# Patient Record
Sex: Female | Born: 1940
Health system: Southern US, Community
[De-identification: ages and names within clinical notes are randomized; demographics above are authoritative.]

## PROBLEM LIST (undated history)

## (undated) DIAGNOSIS — T7840XA Allergy, unspecified, initial encounter: Secondary | ICD-10-CM

## (undated) DIAGNOSIS — H269 Unspecified cataract: Secondary | ICD-10-CM

## (undated) DIAGNOSIS — I10 Essential (primary) hypertension: Secondary | ICD-10-CM

## (undated) DIAGNOSIS — R011 Cardiac murmur, unspecified: Secondary | ICD-10-CM

## (undated) DIAGNOSIS — C801 Malignant (primary) neoplasm, unspecified: Secondary | ICD-10-CM

## (undated) DIAGNOSIS — E079 Disorder of thyroid, unspecified: Secondary | ICD-10-CM

## (undated) HISTORY — DX: Cardiac murmur, unspecified: R01.1

## (undated) HISTORY — PX: EYE SURGERY: SHX253

## (undated) HISTORY — DX: Disorder of thyroid, unspecified: E07.9

## (undated) HISTORY — DX: Unspecified cataract: H26.9

## (undated) HISTORY — DX: Essential (primary) hypertension: I10

## (undated) HISTORY — DX: Malignant (primary) neoplasm, unspecified: C80.1

## (undated) HISTORY — PX: BREAST BIOPSY: SHX20

## (undated) HISTORY — DX: Allergy, unspecified, initial encounter: T78.40XA

---
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
## 1974-01-03 HISTORY — PX: CHOLECYSTECTOMY: SHX55

## 1986-01-03 HISTORY — PX: ABDOMINAL HYSTERECTOMY: SHX81

## 1987-01-04 HISTORY — PX: THYROID SURGERY: SHX805

## 2006-02-20 ENCOUNTER — Ambulatory Visit: Payer: Self-pay | Admitting: Gastroenterology

## 2006-03-06 ENCOUNTER — Ambulatory Visit: Payer: Self-pay | Admitting: Gastroenterology

## 2011-02-16 ENCOUNTER — Encounter: Payer: Self-pay | Admitting: Gastroenterology

## 2011-11-15 ENCOUNTER — Encounter: Payer: Self-pay | Admitting: Gastroenterology

## 2018-06-12 ENCOUNTER — Other Ambulatory Visit: Payer: Self-pay

## 2018-06-12 ENCOUNTER — Encounter: Payer: Self-pay | Admitting: Family Medicine

## 2018-06-12 ENCOUNTER — Ambulatory Visit (INDEPENDENT_AMBULATORY_CARE_PROVIDER_SITE_OTHER): Payer: Medicare Other | Admitting: Family Medicine

## 2018-06-12 DIAGNOSIS — E890 Postprocedural hypothyroidism: Secondary | ICD-10-CM | POA: Diagnosis not present

## 2018-06-12 MED ORDER — SYNTHROID 125 MCG PO TABS: 125.0000 ug | ORAL_TABLET | Freq: Every day | ORAL | 3 refills | Status: DC

## 2018-06-12 NOTE — Progress Notes (Signed)
Chief Complaint  Patient presents with  . New Patient (Initial Visit)       New Patient Visit SUBJECTIVE: HPI: Melanie Holmes is an 78 y.o.female who is being seen for establishing care.  The patient was previously seen at [facility]. Due to COVID-19 pandemic, we are interacting via web portal for an electronic face-to-face visit. I verified patient's ID using 2 identifiers. Patient agreed to proceed with visit via this method. Patient is at home, I am at home. Patient and I are present for visit.   Hypothyroidism Patient presents for follow-up of hypothyroidism.  Reports compliance with medication- changed to generic levothyroxine 112 mcg/d in Jan, 2020. Current symptoms include: fatigue, weight gain and feeling slow Denies: feeling cold and cold intolerance, losing hair, anxiousness, feeling excessive energy, tremulousness, palpitations and sweating She believes her dose should be increased Did best on 125 mcg/d, brand name Synthroid.   Allergies  Allergen Reactions  . Clonidine Derivatives     Do not remember what happens  . Metoprolol     Heart pounding   . Rosuvastatin     Muscle aches  . Simvastatin     Muscle aches  . Adhesive [Tape] Rash  . Morphine And Related Rash  . Penicillins Rash    No past medical history on file. Family History  Problem Relation Age of Onset  . Colon cancer Mother   . Esophageal cancer Father    Allergies  Allergen Reactions  . Clonidine Derivatives     Do not remember what happens  . Metoprolol     Heart pounding   . Rosuvastatin     Muscle aches  . Simvastatin     Muscle aches  . Adhesive [Tape] Rash  . Morphine And Related Rash  . Penicillins Rash    Current Outpatient Medications:  .  SYNTHROID 125 MCG tablet, Take 1 tablet (125 mcg total) by mouth daily before breakfast., Disp: 30 tablet, Rfl: 3  ROS Cardiovascular: Denies chest pain  Respiratory: Denies dyspnea   OBJECTIVE: No conversational dyspnea Age appropriate judgment  and insight Nml affect and mood  ASSESSMENT/PLAN: Postablative hypothyroidism - Plan: SYNTHROID 125 MCG tablet  Patient instructed to sign release of records form from her previous PCP. Go back to brand name Synthroid, 125 mcg/d to see how she does.  Patient should return 7 weeks, reck thyroid/energy levels. The patient voiced understanding and agreement to the plan.   [place], DO 06/12/18  1:07 PM

## 2018-08-20 ENCOUNTER — Encounter: Payer: Self-pay | Admitting: Family Medicine

## 2018-08-21 ENCOUNTER — Encounter: Payer: Self-pay | Admitting: Family Medicine

## 2018-08-21 ENCOUNTER — Telehealth: Payer: Self-pay

## 2018-08-21 ENCOUNTER — Ambulatory Visit (INDEPENDENT_AMBULATORY_CARE_PROVIDER_SITE_OTHER): Payer: Medicare Other | Admitting: Family Medicine

## 2018-08-21 ENCOUNTER — Other Ambulatory Visit: Payer: Self-pay

## 2018-08-21 VITALS — BP 140/82 | HR 91 | Temp 97.4°F | Ht 66.0 in | Wt 213.2 lb

## 2018-08-21 DIAGNOSIS — N3281 Overactive bladder: Secondary | ICD-10-CM | POA: Diagnosis not present

## 2018-08-21 DIAGNOSIS — E890 Postprocedural hypothyroidism: Secondary | ICD-10-CM | POA: Insufficient documentation

## 2018-08-21 MED ORDER — MIRABEGRON ER 25 MG PO TB24: 25.0000 mg | ORAL_TABLET | Freq: Every day | ORAL | 1 refills | Status: DC

## 2018-08-21 NOTE — Progress Notes (Signed)
Chief Complaint  Patient presents with  . bladder problem and labs    labs for thyroid    Subjective: Patient is a 78 y.o. female here for f/u thyroid.   Hypothyroidism Patient presents for follow-up of hypothyroidism.  Reports compliance with medication. Current symptoms include: denies fatigue, unxplained weight changes, heat/cold intolerance, bowel/skin changes or CVS symptoms She believes her dose should be unchanged  Has hx of overactive bladder that responded well to medication. Upon review of records, she was on Myrbetriq. Did not have issues with either cost or AE's. Going to work with [employer] for next 1.5 mo and would like some so she does not have to go every 2 hrs. S/s's does not bother her enough for  A medication in her current situation. Denies bleeding or pain.   ROS: Endo: As noted in HPI GU: +freq  Past Medical History:  Diagnosis Date  . Thyroid disease     Objective: BP 140/82 (BP Location: Left Arm, Patient Position: Sitting, Cuff Size: Normal)   Pulse 91   Temp (!) 97.4 F (36.3 C) (Oral)   Ht 5\' 6"  (1.676 m)   Wt 213 lb 4 oz (96.7 kg)   SpO2 97%   BMI 34.42 kg/m  General: Awake, appears stated age HEENT: MMM, EOMi Heart: RRR, no LE edema GI: BS+, S, NT, ND Lungs: CTAB, no rales, wheezes or rhonchi. No accessory muscle use Psych: Age appropriate judgment and insight, normal affect and mood  Assessment and Plan: Postablative hypothyroidism - Plan: T4, free, TSH, she feels good at current dose. Hopefully no changes.  Overactive bladder - Plan: mirabegron ER (MYRBETRIQ) 25 MG TB24 tablet, short course during next 1.5 mo.   Orders as above. The patient voiced understanding and agreement to the plan.  [place], DO 08/21/18  3:59 PM

## 2018-08-21 NOTE — Telephone Encounter (Signed)
Myrbetriq not covered- preferrred alternatives: oxybutynin 5mg  oxybutynin ER 5MG , 10MG , 15MG , Trospium 20MG , tolterodine tartrate ER 2mg  and 4mg .

## 2018-08-21 NOTE — Telephone Encounter (Signed)
Noted  

## 2018-08-21 NOTE — Patient Instructions (Signed)
Give us 2-3 business days to get the results of your labs back.   Keep the diet clean and stay active.  I recommend getting the flu shot in mid October. This suggestion would change if the CDC comes out with a different recommendation.   Let us know if you need anything. 

## 2018-08-21 NOTE — Telephone Encounter (Signed)
Let pt know the change and see if she is OK with [place] making a change. Ty.

## 2018-08-21 NOTE — Telephone Encounter (Signed)
Called the patient and she will pay for it to get what she wants.  Please do not change

## 2018-08-22 LAB — TSH: TSH: 0.14 u[IU]/mL — ABNORMAL LOW (ref 0.35–4.50)

## 2018-08-22 LAB — T4, FREE: Free T4: 0.93 ng/dL (ref 0.60–1.60)

## 2018-10-08 ENCOUNTER — Other Ambulatory Visit: Payer: Self-pay | Admitting: Family Medicine

## 2018-10-08 DIAGNOSIS — E890 Postprocedural hypothyroidism: Secondary | ICD-10-CM

## 2018-10-08 MED ORDER — SYNTHROID 125 MCG PO TABS: 125.0000 ug | ORAL_TABLET | Freq: Every day | ORAL | 3 refills | Status: DC

## 2018-10-23 ENCOUNTER — Ambulatory Visit (INDEPENDENT_AMBULATORY_CARE_PROVIDER_SITE_OTHER): Payer: Medicare Other | Admitting: Family Medicine

## 2018-10-23 ENCOUNTER — Encounter: Payer: Self-pay | Admitting: Family Medicine

## 2018-10-23 ENCOUNTER — Other Ambulatory Visit: Payer: Self-pay

## 2018-10-23 VITALS — BP 132/80 | HR 69 | Temp 97.5°F | Ht 66.0 in | Wt 214.4 lb

## 2018-10-23 DIAGNOSIS — Z23 Encounter for immunization: Secondary | ICD-10-CM

## 2018-10-23 DIAGNOSIS — N393 Stress incontinence (female) (male): Secondary | ICD-10-CM | POA: Diagnosis not present

## 2018-10-23 DIAGNOSIS — E2839 Other primary ovarian failure: Secondary | ICD-10-CM | POA: Diagnosis not present

## 2018-10-23 NOTE — Patient Instructions (Addendum)
Keep up the good work.  Continue doing the Kegel exercises.   Don't drink within 1 hr of bedtime. Consider pickle juice before bed or magnesium to manage cramps.   Let us know if you need anything.

## 2018-10-23 NOTE — Progress Notes (Signed)
Chief Complaint  Patient presents with  . Annual Exam    Subjective: Patient is a 78 y.o. female here for f/u.  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 She did not take it 2/2 cost, but didn't need it as she was relocated to working at home. She does leak urine when she sits up. 2 children. No pain or bleeding. Drinks a lot of water, otherwise will have cramps.  Takes magnesium daily.  ROS: GU: + leakage  Past Medical History:  Diagnosis Date  . Thyroid disease     Objective: BP 132/80 (BP Location: Left Arm, Patient Position: Sitting, Cuff Size: Large)   Pulse 69   Temp (!) 97.5 F (36.4 C) (Temporal)   Ht 5\' 6"  (1.676 m)   Wt 214 lb 6 oz (97.2 kg)   SpO2 98%   BMI 34.60 kg/m  General: Awake, appears stated age Heart: RRR, no LE edema Lungs: CTAB, no rales, wheezes or rhonchi. No accessory muscle use Psych: Age appropriate judgment and insight, normal affect and mood  Assessment and Plan: Stress incontinence  Estrogen deficiency - Plan: DG Bone Density  Need for tetanus booster  Need for Td vaccine - Plan: Td vaccine greater than or equal to 7yo preservative free IM  Stay hydrated.  Do not drink water more than 1-2 hours of bedtime.  Consider pickle juice for cramping.  Continue Kegel exercises. I will see her in 1 year. The patient voiced understanding and agreement to the plan.  [place], DO 10/23/18  1:05 PM

## 2018-11-03 ENCOUNTER — Encounter: Payer: Self-pay | Admitting: Family Medicine

## 2019-01-28 ENCOUNTER — Encounter: Payer: Self-pay | Admitting: Family Medicine

## 2019-02-21 ENCOUNTER — Encounter: Payer: Self-pay | Admitting: Family Medicine

## 2019-02-21 DIAGNOSIS — E890 Postprocedural hypothyroidism: Secondary | ICD-10-CM

## 2019-02-21 MED ORDER — SYNTHROID 125 MCG PO TABS
125.0000 ug | ORAL_TABLET | Freq: Every day | ORAL | 5 refills | Status: DC
Start: 2019-02-21 — End: 2019-04-19

## 2019-04-16 ENCOUNTER — Other Ambulatory Visit: Payer: Self-pay

## 2019-04-17 ENCOUNTER — Encounter: Payer: Self-pay | Admitting: Family Medicine

## 2019-04-17 ENCOUNTER — Other Ambulatory Visit: Payer: Self-pay | Admitting: Family Medicine

## 2019-04-17 ENCOUNTER — Telehealth: Payer: Self-pay

## 2019-04-17 ENCOUNTER — Ambulatory Visit (INDEPENDENT_AMBULATORY_CARE_PROVIDER_SITE_OTHER): Payer: Medicare Other | Admitting: Family Medicine

## 2019-04-17 ENCOUNTER — Telehealth: Payer: Self-pay | Admitting: Family Medicine

## 2019-04-17 VITALS — BP 148/90 | HR 72 | Temp 95.4°F | Ht 66.0 in | Wt 220.1 lb

## 2019-04-17 DIAGNOSIS — R5383 Other fatigue: Secondary | ICD-10-CM

## 2019-04-17 DIAGNOSIS — M7989 Other specified soft tissue disorders: Secondary | ICD-10-CM

## 2019-04-17 DIAGNOSIS — E890 Postprocedural hypothyroidism: Secondary | ICD-10-CM | POA: Diagnosis not present

## 2019-04-17 DIAGNOSIS — N3281 Overactive bladder: Secondary | ICD-10-CM

## 2019-04-17 DIAGNOSIS — R252 Cramp and spasm: Secondary | ICD-10-CM | POA: Diagnosis not present

## 2019-04-17 LAB — CBC
HCT: 36.1 % (ref 36.0–46.0)
Hemoglobin: 12.4 g/dL (ref 12.0–15.0)
MCHC: 34.3 g/dL (ref 30.0–36.0)
MCV: 96.2 fl (ref 78.0–100.0)
Platelets: 178 10*3/uL (ref 150.0–400.0)
RBC: 3.75 Mil/uL — ABNORMAL LOW (ref 3.87–5.11)
RDW: 12.3 % (ref 11.5–15.5)
WBC: 5.5 10*3/uL (ref 4.0–10.5)

## 2019-04-17 LAB — COMPREHENSIVE METABOLIC PANEL
ALT: 14 U/L (ref 0–35)
AST: 17 U/L (ref 0–37)
Albumin: 4.2 g/dL (ref 3.5–5.2)
Alkaline Phosphatase: 68 U/L (ref 39–117)
BUN: 16 mg/dL (ref 6–23)
CO2: 29 mEq/L (ref 19–32)
Calcium: 9.5 mg/dL (ref 8.4–10.5)
Chloride: 101 mEq/L (ref 96–112)
Creatinine, Ser: 0.93 mg/dL (ref 0.40–1.20)
GFR: 58.21 mL/min — ABNORMAL LOW (ref >60.00)
Glucose, Bld: 116 mg/dL — ABNORMAL HIGH (ref 70–99)
Potassium: 4.4 mEq/L (ref 3.5–5.1)
Sodium: 138 mEq/L (ref 135–145)
Total Bilirubin: 0.4 mg/dL (ref 0.2–1.2)
Total Protein: 6.3 g/dL (ref 6.0–8.3)

## 2019-04-17 LAB — T4, FREE: Free T4: 1.31 ng/dL (ref 0.60–1.60)

## 2019-04-17 LAB — MAGNESIUM: Magnesium: 1.8 mg/dL (ref 1.5–2.5)

## 2019-04-17 LAB — TSH: TSH: 0.37 u[IU]/mL (ref 0.35–4.50)

## 2019-04-17 MED ORDER — OXYBUTYNIN CHLORIDE ER 10 MG PO TB24: 10.0000 mg | ORAL_TABLET | Freq: Every day | ORAL | 1 refills | Status: DC

## 2019-04-17 MED ORDER — MIRABEGRON ER 25 MG PO TB24: 25.0000 mg | ORAL_TABLET | Freq: Every day | ORAL | 1 refills | Status: DC

## 2019-04-17 NOTE — Progress Notes (Signed)
Chief Complaint  Patient presents with  . Muscle Pain    itching all over  . Urinary Frequency  . Fatigue  . Edema    feet and legs    Subjective: Patient is a 79 y.o. female here for f/u.  Intermittent fatigue lasting for 1 hr at a time. Happens around twice weekly.  She has not been sleeping well as her overactive bladder is causing her to wake up every 2 hours at night.  She is eating and drinking normally overall.  She denies any snoring or waking up gasping for air.  She does take her thyroid medication routinely.  OAB This has gotten worse as she is urinating at night every 2 hrs. No constipation or infectious symptoms.  No known history of diabetes.  We have prescribed her Myrbetriq in the past but it cost $500 out-of-pocket.  She continues have leg cramping and lower extremity swelling.  She is starting to exercise again.  Water intake is somewhat limited due to frequent urination.  She has been taking magnesium daily as well.  Past Medical History:  Diagnosis Date  . Thyroid disease     Objective: BP (!) 148/90 (BP Location: Right Arm, Patient Position: Sitting, Cuff Size: Normal)   Pulse 72   Temp (!) 95.4 F (35.2 C) (Temporal)   Ht 5\' 6"  (1.676 m)   Wt 220 lb 2 oz (99.8 kg)   SpO2 97%   BMI 35.53 kg/m  General: Awake, appears stated age HEENT: MMM, EOMi Neck: Supple, no obvious thyromegaly or nodules appreciated Heart: RRR, 1+ pitting edema bilaterally tapering at the knees, no bruits Lungs: CTAB, no rales, wheezes or rhonchi. No accessory muscle use Abdomen: Bowel sounds present, soft, nontender, nondistended Psych: Age appropriate judgment and insight, normal affect and mood  Assessment and Plan: Postablative hypothyroidism - Plan: TSH, T4, free  Other fatigue - Plan: CBC, Comprehensive metabolic panel  Localized swelling of both lower extremities  Muscle cramp - Plan: Magnesium  Overactive bladder - Plan: mirabegron ER (MYRBETRIQ) 25 MG TB24  tablet  Check above labs.  I think her fatigue is likely related to insufficient sleep from frequent nighttime awakenings.  We will try to call in Myrbetriq again.  If it is too expensive, will call and Ditropan XL. Follow-up in 6 weeks. The patient voiced understanding and agreement to the plan.  [place], DO 04/17/19  2:26 PM

## 2019-04-17 NOTE — Telephone Encounter (Signed)
Myrbetriq not covered by insurance. Preferred alternatives: oxybutynin 10mg , solifenacin 10mg , tolterodine 4mg .

## 2019-04-17 NOTE — Telephone Encounter (Signed)
Alt sent in

## 2019-04-17 NOTE — Patient Instructions (Addendum)
Give [place] 2-3 business days to get the results of your labs back.   For the swelling in your lower extremities, be sure to elevate your legs when able, mind the salt intake, stay physically active and consider wearing compression stockings.  Keep the diet clean and stay active.   Let me know if there are cost issues.   Let us know if you need anything.

## 2019-04-17 NOTE — Telephone Encounter (Signed)
Per patient please do not do anything with insurance papers in the morning. Patient will send a letter to you via my chart tonight.

## 2019-04-19 ENCOUNTER — Other Ambulatory Visit: Payer: Self-pay | Admitting: Family Medicine

## 2019-04-19 DIAGNOSIS — E890 Postprocedural hypothyroidism: Secondary | ICD-10-CM

## 2019-04-19 MED ORDER — SYNTHROID 125 MCG PO TABS: 125.0000 ug | ORAL_TABLET | Freq: Every day | ORAL | 1 refills | Status: DC

## 2019-04-22 ENCOUNTER — Other Ambulatory Visit: Payer: Self-pay | Admitting: Family Medicine

## 2019-04-22 MED ORDER — OXYBUTYNIN CHLORIDE ER 10 MG PO TB24: 10.0000 mg | ORAL_TABLET | Freq: Every day | ORAL | 1 refills | Status: DC

## 2019-05-01 ENCOUNTER — Telehealth: Payer: Medicare Other | Admitting: Family

## 2019-05-01 DIAGNOSIS — M545 Low back pain, unspecified: Secondary | ICD-10-CM

## 2019-05-01 MED ORDER — NAPROXEN 500 MG PO TABS: 500.0000 mg | ORAL_TABLET | Freq: Two times a day (BID) | ORAL | 0 refills | Status: DC

## 2019-05-01 MED ORDER — BACLOFEN 10 MG PO TABS: 5.0000 mg | ORAL_TABLET | Freq: Three times a day (TID) | ORAL | 0 refills | Status: DC

## 2019-05-01 NOTE — Progress Notes (Signed)

## 2019-05-03 ENCOUNTER — Telehealth: Payer: Self-pay | Admitting: Family Medicine

## 2019-05-03 ENCOUNTER — Telehealth: Payer: Medicare Other | Admitting: Physician Assistant

## 2019-05-03 DIAGNOSIS — M549 Dorsalgia, unspecified: Secondary | ICD-10-CM

## 2019-05-03 NOTE — Telephone Encounter (Signed)
Caller: Melanie Holmes  Call Back # [phone]  Subject : Back Pain, Eye Pain  Patient states that she had a e-visit on Wednesday. Patient states still having back pain no relief, want another medication sent to pharmancy.   Please Advise

## 2019-05-03 NOTE — Telephone Encounter (Signed)
Called the patient offered the patient an appt with PCP the first of next week and/or  PCP stated to go to UC this weekend.  The patient agreed to go to UC this weekend. If no better would give [place] a call on Monday to try to schedule with PCP.

## 2019-05-03 NOTE — Telephone Encounter (Signed)
I would follow up with the group that evaluated her for this. Ty.

## 2019-05-03 NOTE — Progress Notes (Signed)
Based on what you shared with me, I feel your condition warrants further evaluation and I recommend that you be seen for a face to face office visit.  Given your symptoms have not improved with the initial treatment plan, and you've experienced side effects with the medications, I would recommend face-to-face evaluation to best formulate a treatment plan and determine if you need any imaging at this time. I would recommend seeking evaluation today at your PCP's office or local urgent care.    NOTE: If you entered your credit card information for this eVisit, you will not be charged. You may see a "hold" on your card for the $35 but that hold will drop off and you will not have a charge processed.   If you are having a true medical emergency please call 911.      For an urgent face to face visit, [place] has five urgent care centers for your convenience:      NEW:  [facility] at [facility] Directions [phone] [address] [place], [place] [address] . 10 am - 6pm Monday - Friday    [facility]) Get Driving Directions [phone] [address] [place], [place] [address] . 10 am to 8 pm Monday-Friday . 12 pm to 8 pm [facility] at [facility] Get Driving Directions [phone] [address] [place], [place] [address] . 8 am to 8 pm Monday-Friday . 9 am to 6 pm Saturday . 11 am to 6 pm Sunday     [facility] at [facility] Get Driving Directions  [phone] [address] [place], [place] [address] . 8 am to 8 pm Monday-Friday . 8 am to 4 pm [facility] Urgent Care at [facility] Directions [phone] [address] [place], [place] [address] . 12 pm to 6 pm Monday-Friday      Your e-visit answers were reviewed by a board certified advanced clinical practitioner  to complete your personal care plan.  Thank you for using e-Visits.   Greater than 5 minutes, yet less than 10 minutes of time have been spent researching, coordinating, and implementing care for this patient today.

## 2019-05-04 ENCOUNTER — Other Ambulatory Visit: Payer: Self-pay

## 2019-05-04 ENCOUNTER — Emergency Department (INDEPENDENT_AMBULATORY_CARE_PROVIDER_SITE_OTHER)
Admission: EM | Admit: 2019-05-04 | Discharge: 2019-05-04 | Disposition: A | Discharge status: Home / Self Care | Payer: Medicare Other | Source: Home / Self Care

## 2019-05-04 DIAGNOSIS — M545 Low back pain, unspecified: Secondary | ICD-10-CM

## 2019-05-04 MED ORDER — METHYLPREDNISOLONE ACETATE 80 MG/ML IJ SUSP
60.0000 mg | Freq: Once | INTRAMUSCULAR | Status: AC
  Administered 2019-05-04: 60 mg via INTRAMUSCULAR

## 2019-05-04 MED ORDER — HYDROCODONE-ACETAMINOPHEN 5-325 MG PO TABS: ORAL_TABLET | ORAL | 0 refills | Status: DC

## 2019-05-04 NOTE — ED Provider Notes (Signed)
[facility]    CSN: [id number] Arrival date & time: 05/04/19  1329      History   Chief Complaint Chief Complaint  Patient presents with  . Back Pain    HPI Gearldene Holmes is a 79 y.o. female.   HPI Melanie Holmes is a 79 y.o. female presenting to UC with c/o 5 days of worsening lower back pain that started on Tuesday, 4/27.  Pt painted her 5 bedroom house and finished on Monday.  On Tuesday, she was sitting down when she bent over to pick up a kitten and felt a "pull, like guitar strings" in her back.  She did an E-visit and was prescribed baclofen and naproxen. She has taken twice daily but developed "flashing" in her eyes and blurred vision yesterday. She called to have her medication changed but was advised she needed to be evaluated in person. Pt plans to see her PCP next week but wants to see if there is a different medication she can take to help with the severe pain. She reports having an overactive bladder so she has to get out of bed several times at night, making her back pain worse. She notes she has trouble making it to the bathroom in time due to having to walk slower with a cane due to the new back pain. Denies radiation of pain or numbness in arms or legs. No change in her bowel or bladder habits.    Past Medical History:  Diagnosis Date  . Thyroid disease     Patient Active Problem List   Diagnosis Date Noted  . Postablative hypothyroidism 08/21/2018    Past Surgical History:  Procedure Laterality Date  . ABDOMINAL HYSTERECTOMY  1988   With ovaries  . BREAST BIOPSY     1961 left side tumor removed, 1983 right side cyst removed  . CHOLECYSTECTOMY  1976  . THYROID SURGERY  1989   "had hot spot removed"  . TONSILLECTOMY  1947    OB History   No obstetric history on file.      Home Medications    Prior to Admission medications   Medication Sig Start Date End Date Taking? Authorizing Provider  HYDROcodone-acetaminophen (NORCO/VICODIN) 5-325 MG  tablet Take 1 tab every 6-8 hours for moderate to severe pain 05/04/19   Lurene Shadow, PA-C  SYNTHROID 125 MCG tablet Take 1 tablet (125 mcg total) by mouth daily before breakfast. 04/19/19   Sharlene Dory, DO    Family History Family History  Problem Relation Age of Onset  . Colon cancer Mother   . Esophageal cancer Father     Social History Social History   Tobacco Use  . Smoking status: Former Games developer  . Smokeless tobacco: Never Used  . Tobacco comment: stopped 40 years ago-06/12/18  Substance Use Topics  . Alcohol use: Not Currently  . Drug use: Never     Allergies   Clonidine derivatives, Metoprolol, Rosuvastatin, Simvastatin, Adhesive [tape], Morphine and related, and Penicillins   Review of Systems Review of Systems  Constitutional: Negative for chills and fever.  Genitourinary: Positive for frequency (chronic, overactive bladder). Negative for dysuria and hematuria.  Musculoskeletal: Positive for back pain, gait problem and myalgias. Negative for neck pain and neck stiffness.  Neurological: Negative for weakness and numbness.     Physical Exam Triage Vital Signs ED Triage Vitals  Enc Vitals Group     BP --      Pulse --  Resp --      Temp --      Temp src --      SpO2 --      Weight 05/04/19 1356 220 lb 0.3 oz (99.8 kg)     Height 05/04/19 1356 5\' 6"  (1.676 m)     Head Circumference --      Peak Flow --      Pain Score 05/04/19 1355 2     Pain Loc --      Pain Edu? --      Excl. in [place]? --    No data found.  Updated Vital Signs BP (!) 152/90 (BP Location: Right Arm)   Pulse 77   Temp 98.1 F (36.7 C) (Oral)   Resp 18   Ht 5\' 6"  (1.676 m)   Wt 220 lb 0.3 oz (99.8 kg)   SpO2 98%   BMI 35.51 kg/m   Visual Acuity Right Eye Distance:   Left Eye Distance:   Bilateral Distance:    Right Eye Near:   Left Eye Near:    Bilateral Near:     Physical Exam Vitals and nursing note reviewed.  Constitutional:      Appearance: Normal  appearance. She is well-developed.     Comments: Pt sitting in exam chair, walking cane by her side, pt leaning on side table, appears uncomfortable but is alert, cooperative and pleasant during exam.  HENT:     Head: Normocephalic and atraumatic.  Cardiovascular:     Rate and Rhythm: Normal rate.  Pulmonary:     Effort: Pulmonary effort is normal. No respiratory distress.  Musculoskeletal:        General: Tenderness present. Normal range of motion.     Cervical back: Normal range of motion.       Back:     Comments: Pt uses cane to help ambulate. Increased pain going from sitting to standing position and rotation at the waist.   Skin:    General: Skin is warm and dry.     Findings: No bruising, erythema or rash.  Neurological:     Mental Status: She is alert and oriented to person, place, and time.  Psychiatric:        Behavior: Behavior normal.      UC Treatments / Results  Labs (all labs ordered are listed, but only abnormal results are displayed) Labs Reviewed - No data to display  EKG   Radiology No results found.  Procedures Procedures (including critical care time)  Medications Ordered in UC Medications  methylPREDNISolone acetate (DEPO-MEDROL) injection 60 mg (60 mg Intramuscular Given 05/04/19 1441)    Initial Impression / Assessment and Plan / UC Course  I have reviewed the triage vital signs and the nursing notes.  Pertinent labs & imaging results that were available during my care of the patient were reviewed by me and considered in my medical decision making (see chart for details).     Discussed plain films, pt would like to hold off until f/u with her PCP or chiropractor Will have pt try norco Discontinue baclofen and naproxen Pt did have some ibuprofen this morning w/o side effects, may continue taking.  Discussed symptoms that warrant emergent care in the ED.  AVS provided.   Final Clinical Impressions(s) / UC Diagnoses   Final diagnoses:    Acute bilateral low back pain without sciatica     Discharge Instructions      Norco/Vicodin (hydrocodone-acetaminophen) is a narcotic pain medication, do  not combine these medications with others containing tylenol. While taking, do not drink alcohol, drive, or perform any other activities that requires focus while taking these medications.   You may also take 400-600mg  ibuprofen every 8 hours for back pain. Be sure to take your medication with food to help prevent stomach upset and stomach ulcers.   Please follow up with your family doctor and/or sports medicine this week for further evaluation and treatment of your back pain.  Call 911 or have your husband drive you to the hospital if pain continues to worsen, difficulty walking, change in bowel or bladder habits, weakness or numbness in your legs, or other new concerning symptoms develop.    ED Prescriptions    Medication Sig Dispense Auth. Provider   HYDROcodone-acetaminophen (NORCO/VICODIN) 5-325 MG tablet Take 1 tab every 6-8 hours for moderate to severe pain 10 tablet Lurene Shadow, PA-C     I have reviewed the PDMP during this encounter.   Lurene Shadow, [place] 05/04/19 1557

## 2019-05-04 NOTE — Discharge Instructions (Addendum)
  Norco/Vicodin (hydrocodone-acetaminophen) is a narcotic pain medication, do not combine these medications with others containing tylenol. While taking, do not drink alcohol, drive, or perform any other activities that requires focus while taking these medications.   You may also take 400-600mg  ibuprofen every 8 hours for back pain. Be sure to take your medication with food to help prevent stomach upset and stomach ulcers.   Please follow up with your family doctor and/or sports medicine this week for further evaluation and treatment of your back pain.  Call 911 or have your husband drive you to the hospital if pain continues to worsen, difficulty walking, change in bowel or bladder habits, weakness or numbness in your legs, or other new concerning symptoms develop.

## 2019-05-04 NOTE — ED Triage Notes (Signed)
Pt c/o back pain since Tues. Recently painted 5 bedrooms in her house, finished Monday. On Tues she felt a "pull" in her back when she bent over to pick up a kitten. Had e visit and was rx'd baclofen and naproxen on 4/30. Baclofen gave her "flashing" and blurred vision. Called e visit to have med changed, and was told she needed to be seen in person.

## 2019-05-05 ENCOUNTER — Emergency Department (HOSPITAL_COMMUNITY): Payer: Medicare Other

## 2019-05-05 ENCOUNTER — Emergency Department (HOSPITAL_COMMUNITY)
Admission: EM | Admit: 2019-05-05 | Discharge: 2019-05-05 | Disposition: A | Discharge status: Home / Self Care | Payer: Medicare Other | Attending: Emergency Medicine | Admitting: Emergency Medicine

## 2019-05-05 ENCOUNTER — Other Ambulatory Visit: Payer: Self-pay

## 2019-05-05 ENCOUNTER — Telehealth: Payer: Self-pay | Admitting: Emergency Medicine

## 2019-05-05 ENCOUNTER — Encounter (HOSPITAL_COMMUNITY): Payer: Self-pay | Admitting: Emergency Medicine

## 2019-05-05 DIAGNOSIS — M545 Low back pain, unspecified: Secondary | ICD-10-CM

## 2019-05-05 DIAGNOSIS — E038 Other specified hypothyroidism: Secondary | ICD-10-CM | POA: Diagnosis not present

## 2019-05-05 DIAGNOSIS — Z87891 Personal history of nicotine dependence: Secondary | ICD-10-CM | POA: Insufficient documentation

## 2019-05-05 MED ORDER — KETOROLAC TROMETHAMINE 15 MG/ML IJ SOLN
15.0000 mg | Freq: Once | INTRAMUSCULAR | Status: AC
  Administered 2019-05-05: 15 mg via INTRAVENOUS
  Filled 2019-05-05: qty 1

## 2019-05-05 MED ORDER — DEXAMETHASONE SODIUM PHOSPHATE 10 MG/ML IJ SOLN
10.0000 mg | Freq: Once | INTRAMUSCULAR | Status: AC
  Administered 2019-05-05: 10 mg via INTRAVENOUS
  Filled 2019-05-05: qty 1

## 2019-05-05 MED ORDER — PREDNISONE 20 MG PO TABS
40.0000 mg | ORAL_TABLET | Freq: Every day | ORAL | 0 refills | Status: DC
Start: 2019-05-05 — End: 2019-10-28

## 2019-05-05 MED ORDER — LORAZEPAM 2 MG/ML IJ SOLN
INTRAMUSCULAR | Status: AC
  Administered 2019-05-05: 1.75 mg
  Filled 2019-05-05: qty 1

## 2019-05-05 MED ORDER — GABAPENTIN 100 MG PO CAPS
300.0000 mg | ORAL_CAPSULE | Freq: Once | ORAL | Status: AC
  Administered 2019-05-05: 300 mg via ORAL
  Filled 2019-05-05: qty 3

## 2019-05-05 MED ORDER — METHOCARBAMOL 500 MG PO TABS
500.0000 mg | ORAL_TABLET | Freq: Three times a day (TID) | ORAL | 0 refills | Status: DC
Start: 2019-05-05 — End: 2019-10-28

## 2019-05-05 MED ORDER — HYDROMORPHONE HCL 1 MG/ML IJ SOLN
0.5000 mg | Freq: Once | INTRAMUSCULAR | Status: AC
  Administered 2019-05-05: 0.5 mg via INTRAVENOUS
  Filled 2019-05-05: qty 1

## 2019-05-05 MED ORDER — LORAZEPAM 2 MG/ML IJ SOLN
1.0000 mg | Freq: Once | INTRAMUSCULAR | Status: AC
  Administered 2019-05-05: 1 mg via INTRAVENOUS
  Filled 2019-05-05: qty 1

## 2019-05-05 MED ORDER — SODIUM CHLORIDE 0.9 % IV SOLN: INTRAVENOUS | Status: DC

## 2019-05-05 NOTE — ED Triage Notes (Signed)
Pt c/o back pain since Tuesday after painting in her house. Seen yesterday at [facility], given pain medication. Pt states she has taken 4 doses of prescribed meds but has been nauseated and now her left ankle is swollen and the bottom of her left foot is numb.

## 2019-05-05 NOTE — Discharge Instructions (Signed)
Continue to take previously prescribed pain medication as needed and also use ibuprofen 600mg  every 8 hours as needed for pain.

## 2019-05-05 NOTE — ED Notes (Signed)
Patient verbalizes understanding of discharge instructions. Opportunity for questioning and answers were provided. Armband removed by staff, pt discharged from ED. Pt. ambulatory and discharged home.  

## 2019-05-05 NOTE — Telephone Encounter (Signed)
Call back to pt regarding increased back pain and nausea -during call pt stated her left foot was numb- denied pain - pt directed to go straight to ER for further evaluation per provider, Waylan Rocher PA-C. Pt verbalized an understanding and stated she had a ride to the hospital and would go to the ER at [facility] for further tests.

## 2019-05-05 NOTE — ED Provider Notes (Signed)
[facility] EMERGENCY DEPARTMENT Provider Note   CSN: [id number] Arrival date & time: 05/05/19  1500     History Chief Complaint  Patient presents with  . Back Pain    Melanie Holmes is a 79 y.o. female.  HPI   79 year old female with lower back pain.  Onset last Tuesday. Denies specific trauma but had been painting her home shortly before symptoms began.  Pain has been constant and progressive since then.  Pain initially began in the middle of her lower back.  Did not particularly lateralize.  Is now since begun radiating around into her buttocks and groin.  She has pain at rest.  Exacerbated by movements.  Sometimes spasms some more acute pain.  No urinary complaints.  No fevers or chills.  Denies any history of significant or back issues.  Today developed some numbness on the bottom of her foot which is what prompted her to come to the emergency room.  She has been evaluated by urgent care recently for the same complaints.  She reports minimal improvement with prescribed NSAIDs, pain medicine and muscle relaxants.  Past Medical History:  Diagnosis Date  . Thyroid disease     Patient Active Problem List   Diagnosis Date Noted  . Postablative hypothyroidism 08/21/2018    Past Surgical History:  Procedure Laterality Date  . ABDOMINAL HYSTERECTOMY  1988   With ovaries  . BREAST BIOPSY     1961 left side tumor removed, 1983 right side cyst removed  . CHOLECYSTECTOMY  1976  . THYROID SURGERY  1989   "had hot spot removed"  . TONSILLECTOMY  1947     OB History   No obstetric history on file.     Family History  Problem Relation Age of Onset  . Colon cancer Mother   . Esophageal cancer Father     Social History   Tobacco Use  . Smoking status: Former Games developer  . Smokeless tobacco: Never Used  . Tobacco comment: stopped 40 years ago-06/12/18  Substance Use Topics  . Alcohol use: Not Currently  . Drug use: Never    Home Medications Prior to  Admission medications   Medication Sig Start Date End Date Taking? Authorizing Provider  HYDROcodone-acetaminophen (NORCO/VICODIN) 5-325 MG tablet Take 1 tab every 6-8 hours for moderate to severe pain 05/04/19  Yes Doroteo Glassman, Erin O, PA-C  magnesium oxide (MAG-OX) 400 MG tablet Take by mouth.   Yes [provider]  Multiple Vitamin (MULTI-VITAMIN) tablet Take by mouth.   Yes [provider]  SYNTHROID 125 MCG tablet Take 1 tablet (125 mcg total) by mouth daily before breakfast. 04/19/19  Yes Wendling, Jilda Roche, DO    Allergies    Baclofen, Clonidine derivatives, Metoprolol, Rosuvastatin, Simvastatin, Adhesive [tape], Morphine and related, and Penicillins  Review of Systems   Review of Systems All systems reviewed and negative, other than as noted in HPI.  Physical Exam Updated Vital Signs BP (!) 199/71 (BP Location: Right Arm)   Pulse 86   Temp 97.9 F (36.6 C) (Oral)   Resp 18   SpO2 95%   Physical Exam Vitals and nursing note reviewed.  Constitutional:      General: She is not in acute distress.    Appearance: She is well-developed.  HENT:     Head: Normocephalic and atraumatic.  Eyes:     General:        Right eye: No discharge.        Left eye: No  discharge.     Conjunctiva/sclera: Conjunctivae normal.  Cardiovascular:     Rate and Rhythm: Normal rate and regular rhythm.     Heart sounds: Normal heart sounds. No murmur. No friction rub. No gallop.   Pulmonary:     Effort: Pulmonary effort is normal. No respiratory distress.     Breath sounds: Normal breath sounds.  Abdominal:     General: There is no distension.     Palpations: Abdomen is soft.     Tenderness: There is no abdominal tenderness.  Musculoskeletal:        General: Tenderness present.     Cervical back: Neck supple.     Comments: TTP across lumbar spine. No worse in midline. No overlying skin changes. Strength and sensation intact in LE.   Skin:    General: Skin is warm and dry.    Neurological:     Mental Status: She is alert.  Psychiatric:        Behavior: Behavior normal.        Thought Content: Thought content normal.     ED Results / Procedures / Treatments   Labs (all labs ordered are listed, but only abnormal results are displayed) Labs Reviewed - No data to display  EKG None  Radiology No results found.   MR LUMBAR SPINE WO CONTRAST  Result Date: 05/05/2019 CLINICAL DATA:  Low back pain EXAM: MRI LUMBAR SPINE WITHOUT CONTRAST TECHNIQUE: Multiplanar, multisequence MR imaging of the lumbar spine was performed. No intravenous contrast was administered. COMPARISON:  None. FINDINGS: Segmentation:  Normal Alignment:  Mild retrolisthesis L1-2, L2-3, L3-4 Vertebrae:  Normal bone marrow.  Negative for fracture or mass. Conus medullaris and cauda equina: Conus extends to the L1-2 level. Conus and cauda equina appear normal. Paraspinal and other soft tissues: Negative for paraspinous mass or adenopathy. No soft tissue edema or fluid collection. Disc levels: L1-2: Mild disc degeneration and mild facet degeneration. Negative for spinal or foraminal stenosis. L2-3: Mild disc degeneration and disc bulging. Mild facet degeneration. No significant spinal or foraminal stenosis L3-4: Shallow broad-based disc protrusion. Moderate facet degeneration. Mild spinal stenosis and mild subarticular stenosis bilaterally. L4-5: Diffuse disc bulging and moderate to advanced facet hypertrophy. Moderate spinal stenosis and moderate subarticular stenosis bilaterally L5-S1: Negative disc space. Bilateral facet degeneration without significant stenosis. IMPRESSION: Negative for fracture or mass Lumbar degenerative changes as above. Mild spinal stenosis L3-4 and moderate spinal stenosis L4-5. Electronically Signed   By: Franchot Gallo M.D.   On: 05/05/2019 20:43    Procedures Procedures (including critical care time)  Medications Ordered in ED Medications  0.9 %  sodium chloride infusion  (has no administration in time range)  HYDROmorphone (DILAUDID) injection 0.5 mg (has no administration in time range)  gabapentin (NEURONTIN) capsule 300 mg (has no administration in time range)  ketorolac (TORADOL) 15 MG/ML injection 15 mg (has no administration in time range)  dexamethasone (DECADRON) injection 10 mg (has no administration in time range)  LORazepam (ATIVAN) injection 1 mg (has no administration in time range)    ED Course  I have reviewed the triage vital signs and the nursing notes.  Pertinent labs & imaging results that were available during my care of the patient were reviewed by me and considered in my medical decision making (see chart for details).    MDM Rules/Calculators/A&P                      78yF with lower back pain.  Neuro exam nonfocal. Symptoms improved with meds. MRI as above. Continue symptomatic tx.   Final Clinical Impression(s) / ED Diagnoses Final diagnoses:  Acute midline low back pain without sciatica    Rx / DC Orders ED Discharge Orders    None       Raeford Razor, MD 05/07/19 2107

## 2019-05-08 ENCOUNTER — Encounter (HOSPITAL_BASED_OUTPATIENT_CLINIC_OR_DEPARTMENT_OTHER): Payer: Self-pay | Admitting: *Deleted

## 2019-05-08 ENCOUNTER — Other Ambulatory Visit: Payer: Self-pay

## 2019-05-08 ENCOUNTER — Emergency Department (HOSPITAL_BASED_OUTPATIENT_CLINIC_OR_DEPARTMENT_OTHER)
Admission: EM | Admit: 2019-05-08 | Discharge: 2019-05-09 | Disposition: A | Discharge status: Home / Self Care | Payer: Medicare Other | Attending: Emergency Medicine | Admitting: Emergency Medicine

## 2019-05-08 DIAGNOSIS — K5900 Constipation, unspecified: Secondary | ICD-10-CM | POA: Diagnosis present

## 2019-05-08 DIAGNOSIS — Z87891 Personal history of nicotine dependence: Secondary | ICD-10-CM | POA: Insufficient documentation

## 2019-05-08 DIAGNOSIS — K5641 Fecal impaction: Secondary | ICD-10-CM | POA: Insufficient documentation

## 2019-05-08 DIAGNOSIS — R338 Other retention of urine: Secondary | ICD-10-CM

## 2019-05-08 DIAGNOSIS — R339 Retention of urine, unspecified: Secondary | ICD-10-CM | POA: Insufficient documentation

## 2019-05-08 DIAGNOSIS — Z9049 Acquired absence of other specified parts of digestive tract: Secondary | ICD-10-CM | POA: Diagnosis not present

## 2019-05-08 DIAGNOSIS — Z79899 Other long term (current) drug therapy: Secondary | ICD-10-CM | POA: Diagnosis not present

## 2019-05-08 MED ORDER — FLEET ENEMA 7-19 GM/118ML RE ENEM
ENEMA | RECTAL | Status: AC
  Administered 2019-05-09: 1
  Filled 2019-05-08: qty 1

## 2019-05-08 NOTE — ED Notes (Signed)
Per patient, gave self enema earlier in the day in addition to taking full bottle of magnesium citrate.

## 2019-05-08 NOTE — ED Provider Notes (Signed)
[facility] Provider Note: [name], MD, FACEP  CSN: [id number] MRN: [id number] ARRIVAL: 05/08/19 at 2052 ROOM: MH09/MH09   CHIEF COMPLAINT  Urinary Retention   HISTORY OF PRESENT ILLNESS  05/08/19 10:56 PM Melanie Holmes is a 79 y.o. female who injured her back April 30, 2019.  She has been on multiple medications for this since including hydrocodone of which she has taken 4 tablets.  She is here with constipation that began this morning.  She administered an over-the-counter enema which allowed her to pass only a few small, hard balls of stool.  She did have a gush of blood after this but no further bleeding.  She then took a laxative and has had passage of soft stool throughout the day but still feels like her rectum is distended and that her sphincter is not working.  She also has the sensation that she cannot void her bladder and that her bladder is full.  She normally urinates regularly about every 2 hours due to overactive bladder.  She is in severe discomfort from her bladder distention.  Bedside bladder scan shows 400 mL of retained urine.   Past Medical History:  Diagnosis Date  . Thyroid disease     Past Surgical History:  Procedure Laterality Date  . ABDOMINAL HYSTERECTOMY  1988   With ovaries  . BREAST BIOPSY     1961 left side tumor removed, 1983 right side cyst removed  . CHOLECYSTECTOMY  1976  . THYROID SURGERY  1989   "had hot spot removed"  . TONSILLECTOMY  1947    Family History  Problem Relation Age of Onset  . Colon cancer Mother   . Esophageal cancer Father     Social History   Tobacco Use  . Smoking status: Former Games developer  . Smokeless tobacco: Never Used  . Tobacco comment: stopped 40 years ago-06/12/18  Substance Use Topics  . Alcohol use: Not Currently  . Drug use: Never    Prior to Admission medications   Medication Sig Start Date End Date Taking? Authorizing Provider  HYDROcodone-acetaminophen (NORCO/VICODIN) 5-325 MG tablet  Take 1 tab every 6-8 hours for moderate to severe pain 05/04/19   Lurene Shadow, PA-C  magnesium oxide (MAG-OX) 400 MG tablet Take 400 mg by mouth daily.     [provider]  methocarbamol (ROBAXIN) 500 MG tablet Take 1 tablet (500 mg total) by mouth 3 (three) times daily. 05/05/19   Raeford Razor, MD  Multiple Vitamin (MULTI-VITAMIN) tablet 1 tablet daily.     [provider]  predniSONE (DELTASONE) 20 MG tablet Take 2 tablets (40 mg total) by mouth daily. 05/05/19   Raeford Razor, MD  SYNTHROID 125 MCG tablet Take 1 tablet (125 mcg total) by mouth daily before breakfast. 04/19/19   Carmelia Roller, Jilda Roche, DO    Allergies Baclofen, Clonidine derivatives, Metoprolol, Rosuvastatin, Simvastatin, Adhesive [tape], Morphine and related, and Penicillins   REVIEW OF SYSTEMS  Negative except as noted here or in the History of Present Illness.   PHYSICAL EXAMINATION  Initial Vital Signs Blood pressure (!) 163/79, pulse 92, temperature 98.1 F (36.7 C), temperature source Oral, resp. rate 18, height 5\' 6"  (1.676 m), weight 95.3 kg, SpO2 98 %.  Examination General: Well-developed, well-nourished female in no acute distress; appearance consistent with age of record HENT: normocephalic; atraumatic Eyes: pupils equal, round and reactive to light; extraocular muscles intact; bilateral pseudophakia Neck: supple Heart: regular rate and rhythm Lungs: clear to auscultation bilaterally Abdomen: soft;  nondistended; suprapubic tenderness; bowel sounds present Rectal: Multiple external hemorrhoids, none actively bleeding; fecal impaction present; no gross blood on examining glove Extremities: No deformity; full range of motion; pulses normal Neurologic: Awake, alert and oriented; motor function intact in all extremities and symmetric; no facial droop Skin: Warm and dry Psychiatric: Normal mood and affect   RESULTS  Summary of this visit's results, reviewed and interpreted by myself:    EKG Interpretation  Date/Time:    Ventricular Rate:    PR Interval:    QRS Duration:   QT Interval:    QTC Calculation:   R Axis:     Text Interpretation:        Laboratory Studies: No results found for this or any previous visit (from the past 24 hour(s)). Imaging Studies: No results found.  ED COURSE and MDM  Nursing notes, initial and subsequent vitals signs, including pulse oximetry, reviewed and interpreted by myself.  Vitals:   05/08/19 2058 05/08/19 2059 05/09/19 0123  BP: (!) 163/79  (!) 158/75  Pulse: 92  90  Resp: 18  18  Temp: 98.1 F (36.7 C)  98.2 F (36.8 C)  TempSrc: Oral  Oral  SpO2: 98%  99%  Weight:  95.3 kg   Height:  5\' 6"  (1.676 m)    Medications  sodium phosphate (FLEET) 7-19 GM/118ML enema (1 enema  Given 05/09/19 0015)    12:54 AM About 700 mL of retained urine obtained by in and out catheterization.  Patient's impaction relieved using commercial "Disimpactor" after soapsuds enema failed to relieve impaction.  Patient states "I have never had such relief in my life".  Patient advised to avoid narcotics or to take a laxative and/or stool softener with the narcotic to prevent recurrence.  PROCEDURES  Procedures   ED DIAGNOSES     ICD-10-CM   1. Fecal impaction in rectum (HCC)  K56.41   2. Acute urinary retention  R33.8        Shanon Rosser, MD 05/09/19 [phone]

## 2019-05-08 NOTE — ED Triage Notes (Signed)
Chronic back pain. States that she was told by her GI doctor to be seen for rectal bleeding. Also reports urinary retention.

## 2019-05-09 DIAGNOSIS — K5641 Fecal impaction: Secondary | ICD-10-CM | POA: Diagnosis not present

## 2019-05-16 ENCOUNTER — Ambulatory Visit: Payer: Self-pay | Admitting: Obstetrics and Gynecology

## 2019-06-05 ENCOUNTER — Ambulatory Visit: Payer: Medicare Other | Admitting: Family Medicine

## 2019-10-03 DIAGNOSIS — E890 Postprocedural hypothyroidism: Secondary | ICD-10-CM

## 2019-10-03 MED ORDER — SYNTHROID 125 MCG PO TABS: 125.0000 ug | ORAL_TABLET | Freq: Every day | ORAL | 1 refills | Status: DC

## 2019-10-28 ENCOUNTER — Other Ambulatory Visit: Payer: Self-pay

## 2019-10-28 ENCOUNTER — Ambulatory Visit (INDEPENDENT_AMBULATORY_CARE_PROVIDER_SITE_OTHER): Payer: Medicare Other | Admitting: Family Medicine

## 2019-10-28 ENCOUNTER — Encounter: Payer: Self-pay | Admitting: Family Medicine

## 2019-10-28 VITALS — BP 142/76 | HR 61 | Temp 97.7°F | Ht 66.0 in | Wt 211.0 lb

## 2019-10-28 DIAGNOSIS — E2839 Other primary ovarian failure: Secondary | ICD-10-CM | POA: Diagnosis not present

## 2019-10-28 DIAGNOSIS — Z0000 Encounter for general adult medical examination without abnormal findings: Secondary | ICD-10-CM

## 2019-10-28 NOTE — Progress Notes (Addendum)
Subjective:   Tysheka Fanguy is a 79 y.o. female who presents for Medicare Annual (Subsequent) preventive examination.  Review of Systems    10 pt ROS neg  Objective:    Today's Vitals   10/28/19 1256  BP: (!) 142/76  Pulse: 61  Temp: 97.7 F (36.5 C)  TempSrc: Oral  SpO2: 100%  Weight: 211 lb (95.7 kg)  Height: 5\' 6"  (1.676 m)   Body mass index is 34.06 kg/m.  Advanced directive: Yes  Current Medications (verified) Outpatient Encounter Medications as of 10/28/2019  Medication Sig  . magnesium oxide (MAG-OX) 400 MG tablet Take 400 mg by mouth daily.   . Multiple Vitamin (MULTI-VITAMIN) tablet 1 tablet daily.   10/30/2019 SYNTHROID 125 MCG tablet Take 1 tablet (125 mcg total) by mouth daily before breakfast.   Allergies (verified) Baclofen, Clonidine derivatives, Metoprolol, Rosuvastatin, Simvastatin, Adhesive [tape], Morphine and related, and Penicillins   History: Past Medical History:  Diagnosis Date  . Thyroid disease    Past Surgical History:  Procedure Laterality Date  . ABDOMINAL HYSTERECTOMY  1988   With ovaries  . BREAST BIOPSY     1961 left side tumor removed, 1983 right side cyst removed  . CHOLECYSTECTOMY  1976  . THYROID SURGERY  1989   "had hot spot removed"  . TONSILLECTOMY  1947   Family History  Problem Relation Age of Onset  . Colon cancer Mother   . Esophageal cancer Father    Social History   Socioeconomic History  . Marital status: Married  Tobacco Use  . Smoking status: Former [employer]  . Smokeless tobacco: Never Used  . Tobacco comment: stopped 40 years ago-06/12/18  Substance and Sexual Activity  . Alcohol use: Not Currently  . Drug use: Never   Tobacco Counseling Counseling given: N/A Comment: stopped 40 years ago-06/12/18  Diabetic? No  Activities of Daily Living In your present state of health, do you have any difficulty performing the following activities: 10/28/2019  Hearing? N  Vision? N  Difficulty concentrating or making  decisions? N  Walking or climbing stairs? N  Dressing or bathing? N  Doing errands, shopping? N  Some recent data might be hidden    Patient Care Team: 10/30/2019, DO as PCP - General (Family Medicine)  Indicate any recent Medical Services you may have received from other than [facility] providers in the past year (date may be approximate).     Assessment:   This is a routine wellness examination for Glorene.  Hearing/Vision screen R- 20/100, L- 20-25  Dietary issues and exercise activities discussed: No dietary recs, she is doing fine. Encouraged wt resistance exercise, cont walking.     Goals   None    Depression Screen PHQ 2/9 Scores 10/28/2019  PHQ - 2 Score 0    Fall Risk No flowsheet data found.  Any stairs in or around the home? Yes  If so, are there any without handrails? Yes  Home free of loose throw rugs in walkways, pet beds, electrical cords, etc? Yes  Adequate lighting in your home to reduce risk of falls? Yes   ASSISTIVE DEVICES UTILIZED TO PREVENT FALLS:  Life alert? No  Use of a cane, walker or w/c? No  Grab bars in the bathroom? Yes  Shower chair or bench in shower? No Elevated toilet seat or a handicapped toilet? No   TIMED UP AND GO:  Was the test performed? No .   Gait steady and fast without use of  assistive device  Cognitive Function: A&Ox4  Immunizations Immunization History  Administered Date(s) Administered  . Influenza, High Dose Seasonal PF 10/07/2015, 09/18/2018  . Influenza-Unspecified 10/08/2014, 09/19/2017, 09/12/2019  . Moderna SARS-COVID-2 Vaccination 02/16/2019, 03/16/2019  . PPD Test 05/20/2009, 06/03/2009  . Pneumococcal Conjugate-13 05/03/2010  . Pneumococcal Polysaccharide-23 01/14/2014  . Td 11/12/2008, 10/23/2018    TDAP status: Up to date Flu Vaccine status: Up to date Pneumococcal vaccine status: Up to date Covid-19 vaccine status: Completed vaccines  Qualifies for Shingles Vaccine? Yes   Zostavax  completed No   Shingrix Completed?: No.    Education has been provided regarding the importance of this vaccine. Patient has been advised to call insurance company to determine out of pocket expense if they have not yet received this vaccine. Advised may also receive vaccine at local pharmacy or Health Dept. Verbalized acceptance and understanding.  Screening Tests Health Maintenance  Topic Date Due  . Hepatitis C Screening  Never done  . DEXA SCAN  Never done  . TETANUS/TDAP  10/22/2028  . INFLUENZA VACCINE  Completed  . COVID-19 Vaccine  Completed  . PNA vac Low Risk Adult  Completed    Health Maintenance  Health Maintenance Due  Topic Date Due  . Hepatitis C Screening  Never done  . DEXA SCAN  Never done    Colorectal cancer screening: No longer required.  Mammogram status: No longer required.  Bone Density status: Ordered today. Pt provided with contact info and advised to call to schedule appt.  Lung Cancer Screening: (Low Dose CT Chest recommended if Age 57-80 years, 30 pack-year currently smoking OR have quit w/in 15years.) does not qualify.   Lung Cancer Screening Referral: No  Additional Screening:  Hepatitis C Screening: does qualify  Vision Screening: Recommended annual ophthalmology exams for early detection of glaucoma and other disorders of the eye. Is the patient up to date with their annual eye exam?  Yes  Who is the provider or what is the name of the office in which the patient attends annual eye exams? Shari Prows, MD If pt is not established with a provider, would they like to be referred to a provider to establish care? Pt is established.   Dental Screening: Recommended annual dental exams for proper oral hygiene  Community Resource Referral / Chronic Care Management: CRR required this visit?  No   CCM required this visit?  No      Plan:     I have personally reviewed and noted the following in the patient's chart:   . Medical and social  history . Use of alcohol, tobacco or illicit drugs  . Current medications and supplements . Functional ability and status . Nutritional status . Physical activity . Advanced directives . List of other physicians . Hospitalizations, surgeries, and ER visits in previous 12 months . Vitals . Screenings to include cognitive, depression, and falls . Referrals and appointments  In addition, I have reviewed and discussed with patient certain preventive protocols, quality metrics, and best practice recommendations. A written personalized care plan for preventive services as well as general preventive health recommendations were provided to patient.     Jilda Roche [place], DO   10/28/2019

## 2019-10-28 NOTE — Patient Instructions (Addendum)
Keep the diet clean and stay active.  The new Shingrix vaccine (for shingles) is a 2 shot series. It can make people feel low energy, achy and almost like they have the flu for 48 hours after injection. Please plan accordingly when deciding on when to get this shot. Call your pharmacy for an appointment to get this. The second shot of the series is less severe regarding the side effects, but it still lasts 48 hours.   Let us know if you need anything.   EXERCISES  RANGE OF MOTION (ROM) AND STRETCHING EXERCISES - Low Back Pain Most people with lower back pain will find that their symptoms get worse with excessive bending forward (flexion) or arching at the lower back (extension). The exercises that will help resolve your symptoms will focus on the opposite motion.  If you have pain, numbness or tingling which travels down into your buttocks, leg or foot, the goal of the therapy is for these symptoms to move closer to your back and eventually resolve. Sometimes, these leg symptoms will get better, but your lower back pain may worsen. This is often an indication of progress in your rehabilitation. Be very alert to any changes in your symptoms and the activities in which you participated in the 24 hours prior to the change. Sharing this information with your caregiver will allow him or her to most efficiently treat your condition. These exercises may help you when beginning to rehabilitate your injury. Your symptoms may resolve with or without further involvement from your physician, physical therapist or athletic trainer. While completing these exercises, remember:   Restoring tissue flexibility helps normal motion to return to the joints. This allows healthier, less painful movement and activity.  An effective stretch should be held for at least 30 seconds.  A stretch should never be painful. You should only feel a gentle lengthening or release in the stretched tissue. FLEXION RANGE OF MOTION AND  STRETCHING EXERCISES:  STRETCH - Flexion, Single Knee to Chest   Lie on a firm bed or floor with both legs extended in front of you.  Keeping one leg in contact with the floor, bring your opposite knee to your chest. Hold your leg in place by either grabbing behind your thigh or at your knee.  Pull until you feel a gentle stretch in your low back. Hold 30 seconds.  Slowly release your grasp and repeat the exercise with the opposite side. Repeat 2 times. Complete this exercise 3 times per week.   STRETCH - Flexion, Double Knee to Chest  Lie on a firm bed or floor with both legs extended in front of you.  Keeping one leg in contact with the floor, bring your opposite knee to your chest.  Tense your stomach muscles to support your back and then lift your other knee to your chest. Hold your legs in place by either grabbing behind your thighs or at your knees.  Pull both knees toward your chest until you feel a gentle stretch in your low back. Hold 30 seconds.  Tense your stomach muscles and slowly return one leg at a time to the floor. Repeat 2 times. Complete this exercise 3 times per week.   STRETCH - Low Trunk Rotation  Lie on a firm bed or floor. Keeping your legs in front of you, bend your knees so they are both pointed toward the ceiling and your feet are flat on the floor.  Extend your arms out to the side. This will  stabilize your upper body by keeping your shoulders in contact with the floor.  Gently and slowly drop both knees together to one side until you feel a gentle stretch in your low back. Hold for 30 seconds.  Tense your stomach muscles to support your lower back as you bring your knees back to the starting position. Repeat the exercise to the other side. Repeat 2 times. Complete this exercise at least 3 times per week.   EXTENSION RANGE OF MOTION AND FLEXIBILITY EXERCISES:  STRETCH - Extension, Prone on Elbows   Lie on your stomach on the floor, a bed will be  too soft. Place your palms about shoulder width apart and at the height of your head.  Place your elbows under your shoulders. If this is too painful, stack pillows under your chest.  Allow your body to relax so that your hips drop lower and make contact more completely with the floor.  Hold this position for 30 seconds.  Slowly return to lying flat on the floor. Repeat 2 times. Complete this exercise 3 times per week.   RANGE OF MOTION - Extension, Prone Press Ups  Lie on your stomach on the floor, a bed will be too soft. Place your palms about shoulder width apart and at the height of your head.  Keeping your back as relaxed as possible, slowly straighten your elbows while keeping your hips on the floor. You may adjust the placement of your hands to maximize your comfort. As you gain motion, your hands will come more underneath your shoulders.  Hold this position 30 seconds.  Slowly return to lying flat on the floor. Repeat 2 times. Complete this exercise 3 times per week.   RANGE OF MOTION- Quadruped, Neutral Spine   Assume a hands and knees position on a firm surface. Keep your hands under your shoulders and your knees under your hips. You may place padding under your knees for comfort.  Drop your head and point your tailbone toward the ground below you. This will round out your lower back like an angry cat. Hold this position for 30 seconds.  Slowly lift your head and release your tail bone so that your back sags into a large arch, like an old horse.  Hold this position for 30 seconds.  Repeat this until you feel limber in your low back.  Now, find your "sweet spot." This will be the most comfortable position somewhere between the two previous positions. This is your neutral spine. Once you have found this position, tense your stomach muscles to support your low back.  Hold this position for 30 seconds. Repeat 2 times. Complete this exercise 3 times per week.    STRENGTHENING EXERCISES - Low Back Sprain These exercises may help you when beginning to rehabilitate your injury. These exercises should be done near your "sweet spot." This is the neutral, low-back arch, somewhere between fully rounded and fully arched, that is your least painful position. When performed in this safe range of motion, these exercises can be used for people who have either a flexion or extension based injury. These exercises may resolve your symptoms with or without further involvement from your physician, physical therapist or athletic trainer. While completing these exercises, remember:   Muscles can gain both the endurance and the strength needed for everyday activities through controlled exercises.  Complete these exercises as instructed by your physician, physical therapist or athletic trainer. Increase the resistance and repetitions only as guided.  You may experience  muscle soreness or fatigue, but the pain or discomfort you are trying to eliminate should never worsen during these exercises. If this pain does worsen, stop and make certain you are following the directions exactly. If the pain is still present after adjustments, discontinue the exercise until you can discuss the trouble with your caregiver.  STRENGTHENING - Deep Abdominals, Pelvic Tilt   Lie on a firm bed or floor. Keeping your legs in front of you, bend your knees so they are both pointed toward the ceiling and your feet are flat on the floor.  Tense your lower abdominal muscles to press your low back into the floor. This motion will rotate your pelvis so that your tail bone is scooping upwards rather than pointing at your feet or into the floor. With a gentle tension and even breathing, hold this position for 3 seconds. Repeat 2 times. Complete this exercise 3 times per week.   STRENGTHENING - Abdominals, Crunches   Lie on a firm bed or floor. Keeping your legs in front of you, bend your knees so they  are both pointed toward the ceiling and your feet are flat on the floor. Cross your arms over your chest.  Slightly tip your chin down without bending your neck.  Tense your abdominals and slowly lift your trunk high enough to just clear your shoulder blades. Lifting higher can put excessive stress on the lower back and does not further strengthen your abdominal muscles.  Control your return to the starting position. Repeat 2 times. Complete this exercise 3 times per week.   STRENGTHENING - Quadruped, Opposite UE/LE Lift   Assume a hands and knees position on a firm surface. Keep your hands under your shoulders and your knees under your hips. You may place padding under your knees for comfort.  Find your neutral spine and gently tense your abdominal muscles so that you can maintain this position. Your shoulders and hips should form a rectangle that is parallel with the floor and is not twisted.  Keeping your trunk steady, lift your right hand no higher than your shoulder and then your left leg no higher than your hip. Make sure you are not holding your breath. Hold this position for 30 seconds.  Continuing to keep your abdominal muscles tense and your back steady, slowly return to your starting position. Repeat with the opposite arm and leg. Repeat 2 times. Complete this exercise 3 times per week.   STRENGTHENING - Abdominals and Quadriceps, Straight Leg Raise   Lie on a firm bed or floor with both legs extended in front of you.  Keeping one leg in contact with the floor, bend the other knee so that your foot can rest flat on the floor.  Find your neutral spine, and tense your abdominal muscles to maintain your spinal position throughout the exercise.  Slowly lift your straight leg off the floor about 6 inches for a count of 3, making sure to not hold your breath.  Still keeping your neutral spine, slowly lower your leg all the way to the floor. Repeat this exercise with each leg 2  times. Complete this exercise 3 times per week.  POSTURE AND BODY MECHANICS CONSIDERATIONS - Low Back Sprain Keeping correct posture when sitting, standing or completing your activities will reduce the stress put on different body tissues, allowing injured tissues a chance to heal and limiting painful experiences. The following are general guidelines for improved posture.  While reading these guidelines, remember:  The exercises  prescribed by your provider will help you have the flexibility and strength to maintain correct postures.  The correct posture provides the best environment for your joints to work. All of your joints have less wear and tear when properly supported by a spine with good posture. This means you will experience a healthier, less painful body.  Correct posture must be practiced with all of your activities, especially prolonged sitting and standing. Correct posture is as important when doing repetitive low-stress activities (typing) as it is when doing a single heavy-load activity (lifting).  RESTING POSITIONS Consider which positions are most painful for you when choosing a resting position. If you have pain with flexion-based activities (sitting, bending, stooping, squatting), choose a position that allows you to rest in a less flexed posture. You would want to avoid curling into a fetal position on your side. If your pain worsens with extension-based activities (prolonged standing, working overhead), avoid resting in an extended position such as sleeping on your stomach. Most people will find more comfort when they rest with their spine in a more neutral position, neither too rounded nor too arched. Lying on a non-sagging bed on your side with a pillow between your knees, or on your back with a pillow under your knees will often provide some relief. Keep in mind, being in any one position for a prolonged period of time, no matter how correct your posture, can still lead to  stiffness.  PROPER SITTING POSTURE In order to minimize stress and discomfort on your spine, you must sit with correct posture. Sitting with good posture should be effortless for a healthy body. Returning to good posture is a gradual process. Many people can work toward this most comfortably by using various supports until they have the flexibility and strength to maintain this posture on their own. When sitting with proper posture, your ears will fall over your shoulders and your shoulders will fall over your hips. You should use the back of the chair to support your upper back. Your lower back will be in a neutral position, just slightly arched. You may place a small pillow or folded towel at the base of your lower back for  support.  When working at a desk, create an environment that supports good, upright posture. Without extra support, muscles tire, which leads to excessive strain on joints and other tissues. Keep these recommendations in mind:  CHAIR:  A chair should be able to slide under your desk when your back makes contact with the back of the chair. This allows you to work closely.  The chair's height should allow your eyes to be level with the upper part of your monitor and your hands to be slightly lower than your elbows.  BODY POSITION  Your feet should make contact with the floor. If this is not possible, use a foot rest.  Keep your ears over your shoulders. This will reduce stress on your neck and low back.  INCORRECT SITTING POSTURES  If you are feeling tired and unable to assume a healthy sitting posture, do not slouch or slump. This puts excessive strain on your back tissues, causing more damage and pain. Healthier options include:  Using more support, like a lumbar pillow.  Switching tasks to something that requires you to be upright or walking.  Talking a brief walk.  Lying down to rest in a neutral-spine position.  PROLONGED STANDING WHILE SLIGHTLY LEANING  FORWARD  When completing a task that requires you to lean forward  while standing in one place for a long time, place either foot up on a stationary 2-4 inch high object to help maintain the best posture. When both feet are on the ground, the lower back tends to lose its slight inward curve. If this curve flattens (or becomes too large), then the back and your other joints will experience too much stress, tire more quickly, and can cause pain.  CORRECT STANDING POSTURES Proper standing posture should be assumed with all daily activities, even if they only take a few moments, like when brushing your teeth. As in sitting, your ears should fall over your shoulders and your shoulders should fall over your hips. You should keep a slight tension in your abdominal muscles to brace your spine. Your tailbone should point down to the ground, not behind your body, resulting in an over-extended swayback posture.   INCORRECT STANDING POSTURES  Common incorrect standing postures include a forward head, locked knees and/or an excessive swayback. WALKING Walk with an upright posture. Your ears, shoulders and hips should all line-up.  PROLONGED ACTIVITY IN A FLEXED POSITION When completing a task that requires you to bend forward at your waist or lean over a low surface, try to find a way to stabilize 3 out of 4 of your limbs. You can place a hand or elbow on your thigh or rest a knee on the surface you are reaching across. This will provide you more stability, so that your muscles do not tire as quickly. By keeping your knees relaxed, or slightly bent, you will also reduce stress across your lower back. CORRECT LIFTING TECHNIQUES  DO :  Assume a wide stance. This will provide you more stability and the opportunity to get as close as possible to the object which you are lifting.  Tense your abdominals to brace your spine. Bend at the knees and hips. Keeping your back locked in a neutral-spine position, lift using  your leg muscles. Lift with your legs, keeping your back straight.  Test the weight of unknown objects before attempting to lift them.  Try to keep your elbows locked down at your sides in order get the best strength from your shoulders when carrying an object.     Always ask for help when lifting heavy or awkward objects. INCORRECT LIFTING TECHNIQUES DO NOT:   Lock your knees when lifting, even if it is a small object.  Bend and twist. Pivot at your feet or move your feet when needing to change directions.  Assume that you can safely pick up even a paperclip without proper posture.

## 2019-10-28 NOTE — Addendum Note (Signed)
Addended by: Radene Gunning on: 10/28/2019 01:32 PM   Modules accepted: Orders

## 2019-10-31 ENCOUNTER — Other Ambulatory Visit: Payer: Self-pay

## 2019-10-31 ENCOUNTER — Ambulatory Visit (HOSPITAL_BASED_OUTPATIENT_CLINIC_OR_DEPARTMENT_OTHER)
Admission: RE | Admit: 2019-10-31 | Discharge: 2019-10-31 | Disposition: A | Discharge status: Home / Self Care | Payer: Medicare Other | Source: Ambulatory Visit | Attending: Family Medicine | Admitting: Family Medicine

## 2019-10-31 DIAGNOSIS — E2839 Other primary ovarian failure: Secondary | ICD-10-CM | POA: Insufficient documentation

## 2020-02-10 ENCOUNTER — Encounter: Payer: Self-pay | Admitting: Family Medicine

## 2020-02-10 ENCOUNTER — Ambulatory Visit (INDEPENDENT_AMBULATORY_CARE_PROVIDER_SITE_OTHER): Payer: Medicare Other | Admitting: Family Medicine

## 2020-02-10 ENCOUNTER — Other Ambulatory Visit: Payer: Self-pay

## 2020-02-10 VITALS — BP 144/86 | HR 72 | Temp 97.4°F | Ht 66.0 in | Wt 216.1 lb

## 2020-02-10 DIAGNOSIS — E890 Postprocedural hypothyroidism: Secondary | ICD-10-CM | POA: Diagnosis not present

## 2020-02-10 DIAGNOSIS — R638 Other symptoms and signs concerning food and fluid intake: Secondary | ICD-10-CM

## 2020-02-10 DIAGNOSIS — L299 Pruritus, unspecified: Secondary | ICD-10-CM

## 2020-02-10 MED ORDER — LORATADINE 10 MG PO TABS: 10.0000 mg | ORAL_TABLET | Freq: Every day | ORAL | 2 refills | Status: DC

## 2020-02-10 NOTE — Progress Notes (Signed)
Chief Complaint  Patient presents with  . Follow-up    Itching     Melanie Holmes is a 80 y.o. female here for a skin complaint.  Duration: several year Location: LE's, low back and scalp Pruritic? Yes Painful? No Drainage? No New soaps/lotions/topicals/detergents? No Sick contacts? No Other associated symptoms: no rash/redness. Therapies tried thus far: eliminated gluten and other dietary items, changed detergents and soaps   She is craving salt over the past few weeks. Urinating her normal levels. Not getting dizzy or lightheaded. Taking her thyroid medication routinely.   Past Medical History:  Diagnosis Date  . Thyroid disease     BP (!) 144/86 (BP Location: Right Arm, Patient Position: Sitting, Cuff Size: Normal)   Pulse 72   Temp (!) 97.4 F (36.3 C) (Oral)   Ht 5\' 6"  (1.676 m)   Wt 216 lb 2 oz (98 kg)   SpO2 98%   BMI 34.88 kg/m  Gen: awake, alert, appearing stated age Lungs: No accessory muscle use Skin: no lesions. No drainage, erythema, TTP, fluctuance, excoriation Psych: Age appropriate judgment and insight  Pruritus - Plan: CBC, loratadine (CLARITIN) 10 MG tablet  Salt craving - Plan: Comprehensive metabolic panel  Postablative hypothyroidism - Plan: TSH, T4, free  1. No visible lesions. Start PO anthist. Ck labs. If no better, would consider gabapentin.  2. Ck labs, if all labs neg, will ck AM cortisol.  3. Ck thyroid levels.  F/u pending above. The patient voiced understanding and agreement to the plan.  [place], DO 02/10/20 3:16 PM

## 2020-02-10 NOTE — Patient Instructions (Signed)
Give [place] 2-3 business days to get the results of your labs back.   Stay hydrated.  Let me know if anything changes.  Let us know if you need anything.

## 2020-02-11 LAB — COMPREHENSIVE METABOLIC PANEL
ALT: 13 U/L (ref 0–35)
AST: 16 U/L (ref 0–37)
Albumin: 4.1 g/dL (ref 3.5–5.2)
Alkaline Phosphatase: 72 U/L (ref 39–117)
BUN: 20 mg/dL (ref 6–23)
CO2: 34 mEq/L — ABNORMAL HIGH (ref 19–32)
Calcium: 9.7 mg/dL (ref 8.4–10.5)
Chloride: 100 mEq/L (ref 96–112)
Creatinine, Ser: 1 mg/dL (ref 0.40–1.20)
GFR: 53.6 mL/min — ABNORMAL LOW (ref >60.00)
Glucose, Bld: 112 mg/dL — ABNORMAL HIGH (ref 70–99)
Potassium: 4.7 mEq/L (ref 3.5–5.1)
Sodium: 139 mEq/L (ref 135–145)
Total Bilirubin: 0.4 mg/dL (ref 0.2–1.2)
Total Protein: 6.5 g/dL (ref 6.0–8.3)

## 2020-02-11 LAB — CBC
HCT: 37 % (ref 36.0–46.0)
Hemoglobin: 12.5 g/dL (ref 12.0–15.0)
MCHC: 33.8 g/dL (ref 30.0–36.0)
MCV: 95.8 fl (ref 78.0–100.0)
Platelets: 177 10*3/uL (ref 150.0–400.0)
RBC: 3.87 Mil/uL (ref 3.87–5.11)
RDW: 12.4 % (ref 11.5–15.5)
WBC: 5.5 10*3/uL (ref 4.0–10.5)

## 2020-02-11 LAB — T4, FREE: Free T4: 0.9 ng/dL (ref 0.60–1.60)

## 2020-02-11 LAB — TSH: TSH: 2.62 u[IU]/mL (ref 0.35–4.50)

## 2020-03-20 DIAGNOSIS — E890 Postprocedural hypothyroidism: Secondary | ICD-10-CM

## 2020-03-20 MED ORDER — SYNTHROID 125 MCG PO TABS: 125.0000 ug | ORAL_TABLET | Freq: Every day | ORAL | 1 refills | Status: DC

## 2020-04-21 NOTE — Progress Notes (Signed)
New Patient Note  RE: Melanie Holmes MRN: [id number] DOB: 1940/12/09 Date of Office Visit: 04/22/2020  Consult requested by: Sharlene DoryWendling, Nicholas Paul* Primary care provider: Sharlene DoryWendling, Nicholas Paul, DO  Chief Complaint: Pruritus (Intermittent for years but it is getting more often now, had did food avoidence and then reintroducing them.goes from feet to head. Never been on back, breast, face)  History of Present Illness: I had the pleasure of seeing Melanie Holmes for initial evaluation at the [facility] on 04/22/2020. She is a 80 y.o. female, who is referred here by Sharlene DoryWendling, Nicholas Paul, DO for the evaluation of pruritus.  Itching started about 10-15 years ago which initially sporadic but now occurring more frequently and it does keep her up at night when it occurs.  Mainly itchy on her waist, lower extremities. This never occurs on her face, chest or arms area. No associated rash.  Itching lasts until she takes a hot shower.  Associated symptoms include: none. Suspected triggers are unknown but concerned about gluten allergy (especially honey wheat bread) but tolerates other bread products without any issues. Tried various elimination diets with no benefit.   Denies any fevers, chills, changes in medications, foods, personal care products or recent infections. She has tried the following therapies: zyrtec, benadryl and topical benadryl cream with some benefit. Currently on no daily meds.  Previous work up includes: 2022 - TSH, CBC, CMP.  Dietary History: patient has been eating other foods including milk, eggs, peanut, treenuts, shellfish, fish, soy, okay with sourdough bread, meats, fruits and vegetables. Does not eat sesame.   Patient is up to date with the following cancer screening tests: colonoscopy, mammogram, pap smears.  Moisturizing daily with some type of lanolin containing moisturizer, dreft, bar soaps.   Assessment and Plan: Melanie BernJean is a 80 y.o. female  with: Pruritus Pruritus for the past 15 years which seems to be more frequent in nature.  This does not happen on a daily basis and no specific triggers noted.  Usually hot showers, antihistamines and Benadryl cream helps.  Tried various elimination diets with no benefit.  Concerned about gluten allergy however tolerates certain bread products with no issues. 2022 - TSH, CBC, CMP unremarkable. Patient would like allergy testing today.  Today's skin testing showed: Only positive to tree pollen. Negative to common foods.  This does not explain her pruritus when it's not tree pollen season.   The most common cause of pruritus in this age group is xerosis.   See below for proper skin care.  Keep track of symptoms.   No need to avoid any foods.  May use over the counter antihistamines such as Zyrtec (cetirizine) 10mg  daily in the morning.  May take an additional benadryl 25mg  if needed.   Get bloodwork to rule out other etiologies.   Pollen allergy Denies any significant rhino conjunctivitis symptoms.  Today's skin testing positive to tree pollen only.  Monitor symptoms.  Return in about 4 months (around 08/22/2020).  No orders of the defined types were placed in this encounter.   Lab Orders     ANA w/Reflex     Alpha-Gal Panel     Tryptase  Other allergy screening: Asthma: no Rhino conjunctivitis: no Medication allergy: yes Hymenoptera allergy: no Urticaria: no Eczema:no History of recurrent infections suggestive of immunodeficency: no  Diagnostics: Skin Testing: Environmental allergy panel and select foods. Positive test to: tree pollen. Negative test to: foods.  Results discussed with patient/family.  Airborne Adult Perc -  04/22/20 1409    Time Antigen Placed 1410    Allergen Manufacturer Waynette Buttery    Location Back    Number of Test 59    1. Control-Buffer 50% Glycerol Negative    2. Control-Histamine 1 mg/ml 2+    3. Albumin saline Negative    4. Bahia Negative     5. French Southern Territories Negative    6. Johnson Negative    7. Kentucky Blue Negative    8. Meadow Fescue Negative    9. Perennial Rye Negative    10. Sweet Vernal Negative    11. Timothy Negative    12. Cocklebur Negative    13. Burweed Marshelder Negative    14. Ragweed, short Negative    15. Ragweed, Giant Negative    16. Plantain,  English Negative    17. Lamb's Quarters Negative    18. Sheep Sorrell Negative    19. Rough Pigweed Negative    20. Marsh Elder, Rough Negative    21. Mugwort, Common Negative    22. Ash mix Negative    23. Birch mix Negative    24. Beech American Negative    25. Box, Elder 2+    26. Cedar, red Negative    27. Cottonwood, Guinea-Bissau Negative    28. Elm mix Negative    29. Hickory Negative    30. Maple mix Negative    31. Oak, Guinea-Bissau mix Negative    32. Pecan Pollen Negative    33. Pine mix Negative    34. Sycamore Eastern 2+    35. Walnut, Black Pollen Negative    36. Alternaria alternata Negative    37. Cladosporium Herbarum Negative    38. Aspergillus mix Negative    39. Penicillium mix Negative    40. Bipolaris sorokiniana (Helminthosporium) Negative    41. Drechslera spicifera (Curvularia) Negative    42. Mucor plumbeus Negative    43. Fusarium moniliforme Negative    44. Aureobasidium pullulans (pullulara) Negative    45. Rhizopus oryzae Negative    46. Botrytis cinera Negative    47. Epicoccum nigrum Negative    48. Phoma betae Negative    49. Candida Albicans Negative    50. Trichophyton mentagrophytes Negative    51. Mite, D Farinae  5,000 AU/ml Negative    52. Mite, D Pteronyssinus  5,000 AU/ml Negative    53. Cat Hair 10,000 BAU/ml Negative    54.  Dog Epithelia Negative    55. Mixed Feathers Negative    56. Horse Epithelia Negative    57. Cockroach, German Negative    58. Mouse Negative    59. Tobacco Leaf Negative          Food Perc - 04/22/20 1409      Test Information   Time Antigen Placed 1410    Allergen Manufacturer  Waynette Buttery    Location Back    Number of allergen test 10      Food   1. Peanut Negative    2. Soybean food Negative    3. Wheat, whole Negative    4. Sesame Negative    5. Milk, cow Negative    6. Egg White, chicken Negative    7. Casein Negative    8. Shellfish mix Negative    9. Fish mix Negative    10. Cashew Negative           Past Medical History: Patient Active Problem List   Diagnosis Date Noted  . Pruritus 04/22/2020  .  Pollen allergy 04/22/2020  . Other allergic rhinitis 04/22/2020  . Postablative hypothyroidism 08/21/2018   Past Medical History:  Diagnosis Date  . Thyroid disease    Past Surgical History: Past Surgical History:  Procedure Laterality Date  . ABDOMINAL HYSTERECTOMY  1988   With ovaries  . BREAST BIOPSY     1961 left side tumor removed, 1983 right side cyst removed  . CHOLECYSTECTOMY  1976  . THYROID SURGERY  1989   "had hot spot removed"  . TONSILLECTOMY  1947   Medication List:  Current Outpatient Medications  Medication Sig Dispense Refill  . diphenhydrAMINE-zinc acetate (BENADRYL) cream Apply 1 application topically 3 (three) times daily as needed for itching.    . magnesium oxide (MAG-OX) 400 MG tablet Take 400 mg by mouth daily.     . Multiple Vitamin (MULTI-VITAMIN) tablet 1 tablet daily.     Marland Kitchen SYNTHROID 125 MCG tablet Take 1 tablet (125 mcg total) by mouth daily before breakfast. 90 tablet 1   No current facility-administered medications for this visit.   Allergies: Allergies  Allergen Reactions  . Baclofen Other (See Comments)    Blurred vision , Makes eyes flash.  . Clonidine Derivatives     Do not remember what happens  . Metoprolol     Heart pounding   . Rosuvastatin     Muscle aches  . Simvastatin     Muscle aches  . Adhesive [Tape] Rash  . Morphine And Related Rash  . Penicillins Rash   Social History: Social History   Socioeconomic History  . Marital status: Married    Spouse name: Not on file  . Number  of children: Not on file  . Years of education: Not on file  . Highest education level: Not on file  Occupational History  . Not on file  Tobacco Use  . Smoking status: Former Smoker    Quit date: 08/11/1978    Years since quitting: 41.7  . Smokeless tobacco: Never Used  . Tobacco comment: stopped 40 years ago-06/12/18  Vaping Use  . Vaping Use: Never used  Substance and Sexual Activity  . Alcohol use: Not Currently  . Drug use: Never  . Sexual activity: Not on file  Other Topics Concern  . Not on file  Social History Narrative  . Not on file   Social Determinants of Health   Financial Resource Strain: Not on file  Food Insecurity: Not on file  Transportation Needs: Not on file  Physical Activity: Not on file  Stress: Not on file  Social Connections: Not on file   Lives in a 80 year old house. Smoking: quit in 1980 Occupation: retired  Landscape architect History: Water Damage/mildew in the house: no Engineer, civil (consulting) in the family room: no Carpet in the bedroom: yes Heating: heat pump Cooling: heat pump Pet: yes 2 dogs x 5 yrs  Family History: Family History  Problem Relation Age of Onset  . Colon cancer Mother   . Esophageal cancer Father   . Allergic rhinitis Neg Hx   . Angioedema Neg Hx   . Asthma Neg Hx   . Eczema Neg Hx   . Immunodeficiency Neg Hx   . Urticaria Neg Hx    Review of Systems  Constitutional: Negative for appetite change, chills, fever and unexpected weight change.  HENT: Negative for congestion and rhinorrhea.   Eyes: Negative for itching.  Respiratory: Negative for cough, chest tightness, shortness of breath and wheezing.   Cardiovascular: Negative for chest pain.  Gastrointestinal: Negative for abdominal pain.  Genitourinary: Negative for difficulty urinating.  Skin: Negative for rash.  Allergic/Immunologic: Positive for environmental allergies. Negative for food allergies.  Neurological: Negative for headaches.   Objective: BP 140/80   Pulse 71    Temp 97.8 F (36.6 C) (Temporal)   Resp 16   Ht 5\' 6"  (1.676 m)   Wt 215 lb 9.6 oz (97.8 kg)   SpO2 98%   BMI 34.80 kg/m  Body mass index is 34.8 kg/m. Physical Exam Vitals and nursing note reviewed.  Constitutional:      Appearance: Normal appearance. She is well-developed.  HENT:     Head: Normocephalic and atraumatic.     Right Ear: Tympanic membrane and external ear normal.     Left Ear: Tympanic membrane and external ear normal.     Nose: Nose normal.     Mouth/Throat:     Mouth: Mucous membranes are moist.     Pharynx: Oropharynx is clear.  Eyes:     Conjunctiva/sclera: Conjunctivae normal.  Cardiovascular:     Rate and Rhythm: Normal rate and regular rhythm.     Heart sounds: Normal heart sounds. No murmur heard. No friction rub. No gallop.   Pulmonary:     Effort: Pulmonary effort is normal.     Breath sounds: Normal breath sounds. No wheezing, rhonchi or rales.  Musculoskeletal:     Cervical back: Neck supple.  Skin:    General: Skin is warm and dry.     Findings: No rash.  Neurological:     Mental Status: She is alert and oriented to person, place, and time.  Psychiatric:        Behavior: Behavior normal.    The plan was reviewed with the patient/family, and all questions/concerned were addressed.  It was my pleasure to see Melanie Holmes today and participate in her care. Please feel free to contact me with any questions or concerns.  Sincerely,  Melanie Bern, DO Allergy & Immunology  [facility] office: [phone] [facility] office: [phone]

## 2020-04-22 ENCOUNTER — Encounter: Payer: Self-pay | Admitting: Allergy

## 2020-04-22 ENCOUNTER — Ambulatory Visit (INDEPENDENT_AMBULATORY_CARE_PROVIDER_SITE_OTHER): Payer: Medicare Other | Admitting: Allergy

## 2020-04-22 ENCOUNTER — Other Ambulatory Visit: Payer: Self-pay

## 2020-04-22 VITALS — BP 140/80 | HR 71 | Temp 97.8°F | Resp 16 | Ht 66.0 in | Wt 215.6 lb

## 2020-04-22 DIAGNOSIS — J3089 Other allergic rhinitis: Secondary | ICD-10-CM | POA: Diagnosis not present

## 2020-04-22 DIAGNOSIS — L299 Pruritus, unspecified: Secondary | ICD-10-CM

## 2020-04-22 DIAGNOSIS — Z9109 Other allergy status, other than to drugs and biological substances: Secondary | ICD-10-CM

## 2020-04-22 NOTE — Assessment & Plan Note (Signed)
Pruritus for the past 15 years which seems to be more frequent in nature.  This does not happen on a daily basis and no specific triggers noted.  Usually hot showers, antihistamines and Benadryl cream helps.  Tried various elimination diets with no benefit.  Concerned about gluten allergy however tolerates certain bread products with no issues. 2022 - TSH, CBC, CMP unremarkable. Patient would like allergy testing today.  Today's skin testing showed: Only positive to tree pollen. Negative to common foods.  This does not explain her pruritus when it's not tree pollen season.   The most common cause of pruritus in this age group is xerosis.   See below for proper skin care.  Keep track of symptoms.   No need to avoid any foods.  May use over the counter antihistamines such as Zyrtec (cetirizine) 10mg  daily in the morning.  May take an additional benadryl 25mg  if needed.   Get bloodwork to rule out other etiologies.

## 2020-04-22 NOTE — Assessment & Plan Note (Signed)
Denies any significant rhino conjunctivitis symptoms.  Today's skin testing positive to tree pollen only.  Monitor symptoms.

## 2020-04-22 NOTE — Patient Instructions (Addendum)
Today's skin testing showed: Only positive to tree pollen. Negative to common foods. Results given.  Itching:  See below for proper skin care.  Keep track of symptoms.   No need to avoid any foods.   May use over the counter antihistamines such as Zyrtec (cetirizine) 10mg  daily in the morning.  May take an additional benadryl 25mg  if needed.  Get bloodwork:  We are ordering labs, so please allow 1-2 weeks for the results to come back. With the newly implemented Cures Act, the labs might be visible to you at the same time that they become visible to me. However, I will not address the results until all of the results are back, so please be patient.   Follow up in 4 months or sooner if needed.   Reducing Pollen Exposure . Pollen seasons: trees (spring), grass (summer) and ragweed/weeds (fall). [birth year]-1982 Keep windows closed in your home and car to lower pollen exposure.  05-12-1980 air conditioning in the bedroom and throughout the house if possible.  . Avoid going out in dry windy days - especially early morning. . Pollen counts are highest between 5 - 10 AM and on dry, hot and windy days.  . Save outside activities for late afternoon or after a heavy rain, when pollen levels are lower.  . Avoid mowing of grass if you have grass pollen allergy. Marland Kitchen Be aware that pollen can also be transported indoors on people and pets.  . Dry your clothes in an automatic dryer rather than hanging them outside where they might collect pollen.  . Rinse hair and eyes before bedtime.   Skin care recommendations  Bath time: . Always use lukewarm water. AVOID very hot or cold water. Lilian Kapur Keep bathing time to 5-10 minutes. . Do NOT use bubble bath. . Use a mild soap and use just enough to wash the dirty areas. . Do NOT scrub skin vigorously.  . After bathing, pat dry your skin with a towel. Do NOT rub or scrub the skin.  Moisturizers and prescriptions:  . ALWAYS apply moisturizers immediately after bathing  (within 3 minutes). This helps to lock-in moisture. . Use the moisturizer several times a day over the whole body. Marland Kitchen summer moisturizers include: Aveeno, CeraVe, Cetaphil. Marland Kitchen winter moisturizers include: Aquaphor, Vaseline, Cerave, Cetaphil, Eucerin, Vanicream. . When using moisturizers along with medications, the moisturizer should be applied about one hour after applying the medication to prevent diluting effect of the medication or moisturize around where you applied the medications. When not using medications, the moisturizer can be continued twice daily as maintenance.  Laundry and clothing: . Avoid laundry products with added color or perfumes. . Use unscented hypo-allergenic laundry products such as Tide free, Cheer free & gentle, and All free and clear.  . If the skin still seems dry or sensitive, you can try double-rinsing the clothes. . Avoid tight or scratchy clothing such as wool. . Do not use fabric softeners or dyer sheets.

## 2020-04-27 ENCOUNTER — Ambulatory Visit: Payer: Medicare Other | Admitting: Family Medicine

## 2020-05-05 LAB — ALPHA-GAL PANEL
Allergen Lamb IgE: <0.1 kU/L
Beef IgE: <0.1 kU/L
IgE (Immunoglobulin E), Serum: 20 IU/mL (ref 6–495)
O215-IgE Alpha-Gal: <0.1 kU/L
Pork IgE: <0.1 kU/L

## 2020-05-05 LAB — ANA W/REFLEX: Anti Nuclear Antibody (ANA): NEGATIVE

## 2020-05-05 LAB — TRYPTASE: Tryptase: 8.2 ug/L (ref 2.2–13.2)

## 2020-06-25 NOTE — Telephone Encounter (Signed)
Called pt and scheduled an appointment.

## 2020-06-26 ENCOUNTER — Encounter: Payer: Self-pay | Admitting: Family Medicine

## 2020-06-26 ENCOUNTER — Ambulatory Visit (INDEPENDENT_AMBULATORY_CARE_PROVIDER_SITE_OTHER): Payer: Medicare Other | Admitting: Family Medicine

## 2020-06-26 ENCOUNTER — Other Ambulatory Visit: Payer: Self-pay

## 2020-06-26 VITALS — BP 134/80 | HR 67 | Temp 97.6°F | Resp 18 | Ht 66.0 in | Wt 216.4 lb

## 2020-06-26 DIAGNOSIS — L309 Dermatitis, unspecified: Secondary | ICD-10-CM | POA: Diagnosis not present

## 2020-06-26 MED ORDER — METHYLPREDNISOLONE ACETATE 80 MG/ML IJ SUSP
80.0000 mg | Freq: Once | INTRAMUSCULAR | Status: AC
  Administered 2020-06-26: 80 mg via INTRAMUSCULAR

## 2020-06-26 MED ORDER — PREDNISONE 10 MG PO TABS: ORAL_TABLET | ORAL | 0 refills | Status: DC

## 2020-06-26 NOTE — Patient Instructions (Signed)
Rash, Adult °A rash is a change in the color of your skin. A rash can also change the way your skin feels. There are many different conditions and factors that can cause a rash. Some rashes may disappear after a few days, but some may last for a few weeks. Common causes of rashes include: °Viral infections, such as: °Colds. °Measles. °Hand, foot, and mouth disease. °Bacterial infections, such as: °Scarlet fever. °Impetigo. °Fungal infections, such as Candida. °Allergic reactions to food, medicines, or skin care products. °Follow these instructions at home: °The goal of treatment is to stop the itching and keep the rash from spreading. Pay attention to any changes in your symptoms. Follow these instructions to help with your condition: °Medicine °Take or apply over-the-counter and prescription medicines only as told by your health care provider. These may include: °Corticosteroid creams to treat red or swollen skin. °Anti-itch lotions. °Oral allergy medicines (antihistamines). °Oral corticosteroids for severe symptoms. ° °Skin care °Apply cool compresses to the affected areas. °Do not scratch or rub your skin. °Avoid covering the rash. Make sure the rash is exposed to air as much as possible. °Managing itching and discomfort °Avoid hot showers or baths, which can make itching worse. A cold shower may help. °Try taking a bath with: °Epsom salts. Follow manufacturer instructions on the packaging. You can get these at your local pharmacy or grocery store. °Baking soda. Pour a small amount into the bath as told by your health care provider. °Colloidal oatmeal. Follow manufacturer instructions on the packaging. You can get this at your local pharmacy or grocery store. °Try applying baking soda paste to your skin. Stir water into baking soda until it reaches a paste-like consistency. °Try applying calamine lotion. This is an over-the-counter lotion that helps to relieve itchiness. °Keep cool and out of the sun. Sweating  and being hot can make itching worse. °General instructions ° °Rest as needed. °Drink enough fluid to keep your urine pale yellow. °Wear loose-fitting clothing. °Avoid scented soaps, detergents, and perfumes. Use gentle soaps, detergents, perfumes, and other cosmetic products. °Avoid any substance that causes your rash. Keep a journal to help track what causes your rash. Write down: °What you eat. °What cosmetic products you use. °What you drink. °What you wear. This includes jewelry. °Keep all follow-up visits as told by your health care provider. This is important. °Contact a health care provider if: °You sweat at night. °You lose weight. °You urinate more than normal. °You urinate less than normal, or you notice that your urine is a darker color than usual. °You feel weak. °You vomit. °Your skin or the whites of your eyes look yellow (jaundice). °Your skin: °Tingles. °Is numb. °Your rash: °Does not go away after several days. °Gets worse. °You are: °Unusually thirsty. °More tired than normal. °You have: °New symptoms. °Pain in your abdomen. °A fever. °Diarrhea. °Get help right away if you: °Have a fever and your symptoms suddenly get worse. °Develop confusion. °Have a severe headache or a stiff neck. °Have severe joint pains or stiffness. °Have a seizure. °Develop a rash that covers all or most of your body. The rash may or may not be painful. °Develop blisters that: °Are on top of the rash. °Grow larger or grow together. °Are painful. °Are inside your nose or mouth. °Develop a rash that: °Looks like purple pinprick-sized spots all over your body. °Has a "bull's eye" or looks like a target. °Is not related to sun exposure, is red and painful, and causes   your skin to peel. °Summary °A rash is a change in the color of your skin. Some rashes disappear after a few days, but some may last for a few weeks. °The goal of treatment is to stop the itching and keep the rash from spreading. °Take or apply over-the-counter  and prescription medicines only as told by your health care provider. °Contact a health care provider if you have new or worsening symptoms. °Keep all follow-up visits as told by your health care provider. This is important. °This information is not intended to replace advice given to you by your health care provider. Make sure you discuss any questions you have with your health care provider. °Document Revised: 04/13/2018 Document Reviewed: 07/24/2017 °Elsevier Patient Education © 2022 [employer] ° °

## 2020-06-26 NOTE — Progress Notes (Signed)
Patient ID: Melanie Holmes, female    DOB: 03-13-1940  Age: 80 y.o. MRN: [id number]    Subjective:  Subjective  HPI Melanie Holmes presents for an office visit today. She complains of rash on her face, arms, and abdomen x 3-4 days. Pt has a Mhx of itching on the extremities. She states that the rash itches and had worsen overtime. She notes that she has not come in contact with any allergen or change detergent, soaps, and topicals. She endorses taking oatmeal bath, OTC itch medication, benadryl cream, and Zyrtec however there is no relief.  She also complains of sleep disturbance secondary to rash. She notes that she wakes up 1-2 hours at night, scratching her rash.  She also complains of constipation secondary to taking pain medication. Pt is requesting for medication to relieved constipation. On 05/08/2019 pt was at the ED for fecal impaction in rectum.  She denies any chest pain, SOB, fever, abdominal pain, cough, chills, sore throat, dysuria, urinary incontinence, back pain, HA, or N/V/D at this time.   Review of Systems  Constitutional:  Negative for chills, fatigue and fever.  HENT:  Negative for ear pain, rhinorrhea, sinus pressure, sinus pain, sore throat and tinnitus.   Eyes:  Negative for pain.  Respiratory:  Negative for cough, shortness of breath and wheezing.   Cardiovascular:  Negative for chest pain.  Gastrointestinal:  Positive for constipation (secondary to pain medication). Negative for abdominal pain, anal bleeding, diarrhea, nausea and vomiting.  Genitourinary:  Negative for flank pain.  Musculoskeletal:  Negative for back pain and neck pain.  Skin:  Positive for rash.  Neurological:  Negative for seizures, weakness, light-headedness, numbness and headaches.   History Past Medical History:  Diagnosis Date   Thyroid disease     She has a past surgical history that includes Tonsillectomy (1947); Cholecystectomy (1976); Thyroid surgery (1989); Breast biopsy; and Abdominal  hysterectomy (1988).   Her family history includes Colon cancer in her mother; Esophageal cancer in her father.She reports that she quit smoking about 41 years ago. She has never used smokeless tobacco. She reports previous alcohol use. She reports that she does not use drugs.  Current Outpatient Medications on File Prior to Visit  Medication Sig Dispense Refill   diphenhydrAMINE-zinc acetate (BENADRYL) cream Apply 1 application topically 3 (three) times daily as needed for itching.     magnesium oxide (MAG-OX) 400 MG tablet Take 400 mg by mouth daily.      Multiple Vitamin (MULTI-VITAMIN) tablet 1 tablet daily.      SYNTHROID 125 MCG tablet Take 1 tablet (125 mcg total) by mouth daily before breakfast. 90 tablet 1   No current facility-administered medications on file prior to visit.     Objective:  Objective  Physical Exam Vitals and nursing note reviewed.  Constitutional:      General: She is not in acute distress.    Appearance: Normal appearance. She is well-developed. She is not ill-appearing.  HENT:     Head: Normocephalic and atraumatic.     Right Ear: External ear normal.     Left Ear: External ear normal.     Nose: Nose normal.  Eyes:     General:        Right eye: No discharge.        Left eye: No discharge.     Extraocular Movements: Extraocular movements intact.     Pupils: Pupils are equal, round, and reactive to light.  Cardiovascular:     Rate  and Rhythm: Normal rate and regular rhythm.     Pulses: Normal pulses.     Heart sounds: Normal heart sounds. No murmur heard.   No friction rub. No gallop.  Pulmonary:     Effort: Pulmonary effort is normal. No respiratory distress.     Breath sounds: Normal breath sounds. No stridor. No wheezing, rhonchi or rales.  Chest:     Chest wall: No tenderness.  Abdominal:     General: Bowel sounds are normal. There is no distension.     Palpations: Abdomen is soft. There is no mass.     Tenderness: There is no abdominal  tenderness. There is no guarding or rebound.     Hernia: No hernia is present.  Musculoskeletal:        General: Normal range of motion.     Cervical back: Normal range of motion and neck supple.     Right lower leg: No edema.     Left lower leg: No edema.  Skin:    General: Skin is warm and dry.     Findings: Rash present. Rash is macular and papular.     Comments: There is macular and papular rash present in the face, chest, and abdomen. The pt was scratching the rash during PE.  Neurological:     Mental Status: She is alert and oriented to person, place, and time.  Psychiatric:        Behavior: Behavior normal.        Thought Content: Thought content normal.   BP 134/80 (BP Location: Right Arm, Patient Position: Sitting, Cuff Size: Large)   Pulse 67   Temp 97.6 F (36.4 C) (Oral)   Resp 18   Ht 5\' 6"  (1.676 m)   Wt 216 lb 6.4 oz (98.2 kg)   SpO2 98%   BMI 34.93 kg/m  Wt Readings from Last 3 Encounters:  06/26/20 216 lb 6.4 oz (98.2 kg)  04/22/20 215 lb 9.6 oz (97.8 kg)  02/10/20 216 lb 2 oz (98 kg)     Lab Results  Component Value Date   WBC 5.5 02/10/2020   HGB 12.5 02/10/2020   HCT 37.0 02/10/2020   PLT 177.0 02/10/2020   GLUCOSE 112 (H) 02/10/2020   ALT 13 02/10/2020   AST 16 02/10/2020   NA 139 02/10/2020   K 4.7 02/10/2020   CL 100 02/10/2020   CREATININE 1.00 02/10/2020   BUN 20 02/10/2020   CO2 34 (H) 02/10/2020   TSH 2.62 02/10/2020    DG Bone Density  Result Date: 10/31/2019 EXAM: DUAL X-RAY ABSORPTIOMETRY (DXA) FOR BONE MINERAL DENSITY IMPRESSION: 11/02/2019 [facility] Your patient Melanie Holmes completed a BMD test on 10/31/2019 using the Lunar IDXA DXA System (analysis version: 16.SP2) manufactured by 11/02/2019. The following summarizes the results of our evaluation. [facility] PATIENT: Name: Melanie Holmes Patient ID: Melanie Holmes Birth Date: Aug 12, 1940 Height: 65.5 in. Gender: Female Measured: 10/31/2019 Weight: 210.0 lbs. Indications: Advanced Age,  Caucasian, Early Menopause, Estrogen Deficiency, Hypothyroidism, Hysterectomy, Low Calcium Intake, Oophorectomy ( Bilateral), Post Menopausal, Previous Tobacco User Fractures: Humerus Treatments: HRT ASSESSMENT: The BMD measured at Femur Neck Left is 0.905 g/cm2 with a T-score of -1.0. This patient is considered normal according to World Health Organization [facility]) criteria. The scan quality is good. Site Region Measured Date Measured Age WHO YA BMD Classification T-score AP Spine L1-L4 10/31/2019 79.1 Normal 1.9 1.404 g/cm2 DualFemur Neck Left 10/31/2019 79.1 years Normal -1.0 0.905 g/cm2 World Health Organization [facility]) criteria for  post-menopausal, Caucasian Women: Normal        T-score at or above -1 SD Low Bone Mass T-score between -1 and -2.5 SD Osteoporosis  T-score at or below -2.5 SD RECOMMENDATION:1. All patients should optimize calcium and vitamin D intake. 2. Consider FDA-approved medical therapies in postmenopausal women and men aged 1 years and older, based on the following: a. A hip or vertebral(clinical or morphometric) fracture. b. T-Score < -2.5 at the femoral neck or spine after appropriate evaluation to exclude secondary causes c. Low bone mass (T-score between -1.0 and -2.5 at the femoral neck or spine) and a 10 year probability of a hip fracture >3% or a 10 year probability of major osteoporosis-related fracture > 20% based on the [place]-adapted WHO algorithm d. Clinical judgement and/or patient preferences may indicate treatment for people with 10-year fracture probabilities above or below these levels FOLLOW-UP: Patients with diagnosis of osteoporosis or at high risk for fracture should have regular bone mineral density tests. For patients eligible for Medicare, routine testing is allowed once every 2 years. The testing frequency can be increased to one year for patients who have rapidly progressing disease, those who are receiving or discontinuing medical therapy to restore bone mass, or have  additional risk factors. I have reviewed this report and agree with the above findings. [facility] Electronically Signed   By: Bretta Bang III M.D.   On: 10/31/2019 15:41     Assessment & Plan:  Plan    Meds ordered this encounter  Medications   predniSONE (DELTASONE) 10 MG tablet    Sig: TAKE 3 TABLETS PO QD FOR 3 DAYS THEN TAKE 2 TABLETS PO QD FOR 3 DAYS THEN TAKE 1 TABLET PO QD FOR 3 DAYS THEN TAKE 1/2 TAB PO QD FOR 3 DAYS    Dispense:  20 tablet    Refill:  0   methylPREDNISolone acetate (DEPO-MEDROL) injection 80 mg    Problem List Items Addressed This Visit   None Visit Diagnoses     Dermatitis    -  Primary   Relevant Medications   predniSONE (DELTASONE) 10 MG tablet   methylPREDNISolone acetate (DEPO-MEDROL) injection 80 mg (Completed)     Can con't benadryl prn  Start prednisone tomorrow am   Follow-up: Return if symptoms worsen or fail to improve.   I,Gordon Zheng,acting as a Neurosurgeon for [employer], DO.,have documented all relevant documentation on the behalf of Donato Schultz, DO,as directed by  Donato Schultz, DO while in the presence of Donato Schultz, DO.  I, Donato Schultz, DO, have reviewed all documentation for this visit. The documentation on 06/26/20 for the exam, diagnosis, procedures, and orders are all accurate and complete.

## 2020-08-18 ENCOUNTER — Other Ambulatory Visit: Payer: Self-pay | Admitting: Family Medicine

## 2020-08-18 DIAGNOSIS — E890 Postprocedural hypothyroidism: Secondary | ICD-10-CM

## 2021-04-29 ENCOUNTER — Encounter: Payer: Self-pay | Admitting: Family Medicine

## 2021-05-11 ENCOUNTER — Ambulatory Visit: Payer: Medicare Other

## 2021-06-02 ENCOUNTER — Ambulatory Visit: Payer: Medicare Other

## 2021-06-04 ENCOUNTER — Ambulatory Visit (INDEPENDENT_AMBULATORY_CARE_PROVIDER_SITE_OTHER): Payer: Medicare Other

## 2021-06-04 DIAGNOSIS — Z0000 Encounter for general adult medical examination without abnormal findings: Secondary | ICD-10-CM | POA: Diagnosis not present

## 2021-06-04 NOTE — Progress Notes (Addendum)
Subjective:   Melanie Holmes is a 81 y.o. female who presents for Medicare Annual (Subsequent) preventive examination.  I connected with  Melanie Holmes on 06/04/21 by a audio enabled telemedicine application and verified that I am speaking with the correct person using two identifiers.  Patient Location: Home  Provider Location: Office/Clinic  I discussed the limitations of evaluation and management by telemedicine. The patient expressed understanding and agreed to proceed.   Review of Systems     Cardiac Risk Factors include: advanced age (>64men, >68 women)     Objective:    There were no vitals filed for this visit. There is no height or weight on file to calculate BMI.     06/04/2021   11:02 AM 05/08/2019    8:59 PM 05/05/2019    4:03 PM  Advanced Directives  Does Patient Have a Medical Advance Directive? Yes No No  Type of Estate agent of Melanie Holmes;Out of facility DNR (pink MOST or yellow form);Living will    Does patient want to make changes to medical advance directive? No - Patient declined    Copy of Healthcare Power of Attorney in Chart? No - copy requested    Would patient like information on creating a medical advance directive?   No - Patient declined    Current Medications (verified) Outpatient Encounter Medications as of 06/04/2021  Medication Sig   magnesium oxide (MAG-OX) 400 MG tablet Take 400 mg by mouth daily.    Multiple Vitamin (MULTI-VITAMIN) tablet 1 tablet daily.    SYNTHROID 125 MCG tablet TAKE 1 TABLET DAILY BEFORE BREAKFAST   [DISCONTINUED] diphenhydrAMINE-zinc acetate (BENADRYL) cream Apply 1 application topically 3 (three) times daily as needed for itching.   [DISCONTINUED] predniSONE (DELTASONE) 10 MG tablet TAKE 3 TABLETS PO QD FOR 3 DAYS THEN TAKE 2 TABLETS PO QD FOR 3 DAYS THEN TAKE 1 TABLET PO QD FOR 3 DAYS THEN TAKE 1/2 TAB PO QD FOR 3 DAYS   No facility-administered encounter medications on file as of 06/04/2021.     Allergies (verified) Baclofen, Clonidine derivatives, Metoprolol, Rosuvastatin, Simvastatin, Adhesive [tape], Morphine and related, and Penicillins   History: Past Medical History:  Diagnosis Date   Thyroid disease    Past Surgical History:  Procedure Laterality Date   ABDOMINAL HYSTERECTOMY  1988   With ovaries   BREAST BIOPSY     1961 left side tumor removed, 1983 right side cyst removed   CHOLECYSTECTOMY  1976   THYROID SURGERY  1989   "had hot spot removed"   TONSILLECTOMY  1947   Family History  Problem Relation Age of Onset   Colon cancer Mother    Esophageal cancer Father    Allergic rhinitis Neg Hx    Angioedema Neg Hx    Asthma Neg Hx    Eczema Neg Hx    Immunodeficiency Neg Hx    Urticaria Neg Hx    Social History   Socioeconomic History   Marital status: Married    Spouse name: Not on file   Number of children: Not on file   Years of education: Not on file   Highest education level: Not on file  Occupational History   Not on file  Tobacco Use   Smoking status: Former    Types: Cigarettes    Quit date: 08/11/1978    Years since quitting: 42.8   Smokeless tobacco: Never   Tobacco comments:    stopped 40 years ago-06/12/18  Vaping Use   Vaping  Use: Never used  Substance and Sexual Activity   Alcohol use: Not Currently   Drug use: Never   Sexual activity: Not on file  Other Topics Concern   Not on file  Social History Narrative   Not on file   Social Determinants of Health   Financial Resource Strain: Not on file  Food Insecurity: Not on file  Transportation Needs: Not on file  Physical Activity: Not on file  Stress: Not on file  Social Connections: Not on file    Tobacco Counseling Counseling given: Not Answered Tobacco comments: stopped 40 years ago-06/12/18   Clinical Intake:  Pre-visit preparation completed: Yes  Pain : No/denies pain     Nutritional Risks: None Diabetes: No  How often do you need to have someone help you  when you read instructions, pamphlets, or other written materials from your doctor or pharmacy?: 1 - Never  Diabetic?No  Interpreter Needed?: No  Information entered by :: Melanie Holmes   Activities of Daily Living    06/04/2021   11:04 AM  In your present state of health, do you have any difficulty performing the following activities:  Hearing? 0  Vision? 0  Difficulty concentrating or making decisions? 0  Walking or climbing stairs? 1  Dressing or bathing? 0  Doing errands, shopping? 0  Preparing Food and eating ? N  Using the Toilet? N  In the past six months, have you accidently leaked urine? Y  Do you have problems with loss of bowel control? N  Managing your Medications? N  Managing your Finances? N  Housekeeping or managing your Housekeeping? N    Patient Care Team: Sharlene DoryWendling, Nicholas Paul, DO as PCP - General (Family Medicine)  Indicate any recent Medical Services you may have received from other than [facility] providers in the past year (date may be approximate).     Assessment:   This is a routine wellness examination for Melanie Holmes.  Hearing/Vision screen No results found.  Dietary issues and exercise activities discussed: Current Exercise Habits: The patient does not participate in regular exercise at present, Exercise limited by: None identified   Goals Addressed   None    Depression Screen    06/04/2021   11:04 AM 06/04/2021   11:03 AM 10/28/2019    1:00 PM  PHQ 2/9 Scores  PHQ - 2 Score 0 0 0    Fall Risk    06/04/2021   11:02 AM  Fall Risk   Falls in the past year? 0  Number falls in past yr: 0  Injury with Fall? 0  Risk for fall due to : No Fall Risks  Follow up Falls evaluation completed    FALL RISK PREVENTION PERTAINING TO THE HOME:  Any stairs in or around the home? Yes  If so, are there any without handrails? No  Home free of loose throw rugs in walkways, pet beds, electrical cords, etc? Yes  Adequate lighting in your home to reduce risk  of falls? Yes   ASSISTIVE DEVICES UTILIZED TO PREVENT FALLS:  Life alert? No  Use of a cane, walker or w/c? No  Grab bars in the bathroom? Yes  Shower chair or bench in shower? No  Elevated toilet seat or a handicapped toilet? No   TIMED UP AND GO:  Was the test performed? No .    Cognitive Function:        06/04/2021   11:09 AM  6CIT Screen  What Year? 0 points  What month?  0 points  What time? 0 points  Count back from 20 0 points  Months in reverse 0 points  Repeat phrase 0 points  Total Score 0 points    Immunizations Immunization History  Administered Date(s) Administered   Influenza, High Dose Seasonal PF 10/07/2015, 09/18/2018, 09/21/2020   Influenza-Unspecified 10/08/2014, 09/19/2017, 09/12/2019   Moderna Covid-19 Vaccine Bivalent Booster 84yrs & up 11/10/2020   Moderna Sars-Covid-2 Vaccination 02/16/2019, 03/16/2019, 11/04/2019, 04/07/2020   PPD Test 05/20/2009, 06/03/2009   Pneumococcal Conjugate-13 05/03/2010   Pneumococcal Polysaccharide-23 01/14/2014   Td 11/12/2008, 10/23/2018    TDAP status: Up to date  Flu Vaccine status: Up to date  Pneumococcal vaccine status: Up to date  Covid-19 vaccine status: Completed vaccines  Qualifies for Shingles Vaccine? Yes   Zostavax completed No   Shingrix Completed?: No.    Education has been provided regarding the importance of this vaccine. Patient has been advised to call insurance company to determine out of pocket expense if they have not yet received this vaccine. Advised may also receive vaccine at local pharmacy or Health Dept. Verbalized acceptance and understanding.  Screening Tests Health Maintenance  Topic Date Due   Zoster Vaccines- Shingrix (1 of 2) Never done   INFLUENZA VACCINE  08/03/2021   TETANUS/TDAP  10/22/2028   Pneumonia Vaccine 19+ Years old  Completed   DEXA SCAN  Completed   COVID-19 Vaccine  Completed   HPV VACCINES  Aged Out    Health Maintenance  Health Maintenance Due   Topic Date Due   Zoster Vaccines- Shingrix (1 of 2) Never done    Colorectal cancer screening: No longer required.   Mammogram status: No longer required due to aged out.  Bone Density status: Completed 10/31/19. Results reflect: Bone density results: OSTEOPOROSIS. Repeat every 2 years.  Lung Cancer Screening: (Low Dose CT Chest recommended if Age 77-80 years, 30 pack-year currently smoking OR have quit w/in 15years.) does not qualify.   Lung Cancer Screening Referral: N/A  Additional Screening:  Hepatitis C Screening: does not qualify; Completed aged out  Vision Screening: Recommended annual ophthalmology exams for early detection of glaucoma and other disorders of the eye. Is the patient up to date with their annual eye exam?  Yes  Who is the provider or what is the name of the office in which the patient attends annual eye exams? N/A If pt is not established with a provider, would they like to be referred to a provider to establish care? No .   Dental Screening: Recommended annual dental exams for proper oral hygiene  Community Resource Referral / Chronic Care Management: CRR required this visit?  No   CCM required this visit?  No      Plan:     I have personally reviewed and noted the following in the patient's chart:   Medical and social history Use of alcohol, tobacco or illicit drugs  Current medications and supplements including opioid prescriptions.  Functional ability and status Nutritional status Physical activity Advanced directives List of other physicians Hospitalizations, surgeries, and ER visits in previous 12 months Vitals Screenings to include cognitive, depression, and falls Referrals and appointments  In addition, I have reviewed and discussed with patient certain preventive protocols, quality metrics, and best practice recommendations. A written personalized care plan for preventive services as well as general preventive health recommendations  were provided to patient.   Due to this being a telephonic visit, the after visit summary with patients personalized plan was offered to  patient via mail or my-chart.  Patient would like to access on my-chart.   Melanie Holmes, CMA   06/04/2021   Nurse Notes: none   I have reviewed and agree with [facility] documentation.  Willow Ora, MD

## 2021-06-04 NOTE — Patient Instructions (Signed)
Melanie Holmes , Thank you for taking time to come for your Medicare Wellness Visit. I appreciate your ongoing commitment to your health goals. Please review the following plan we discussed and let me know if I can assist you in the future.   Screening recommendations/referrals: Colonoscopy: no longer needed Mammogram: no longer needed  Bone Density: 10/31/19 due 10/30/21 Recommended yearly ophthalmology/optometry visit for glaucoma screening and checkup Recommended yearly dental visit for hygiene and checkup  Vaccinations: Influenza vaccine: up to date Pneumococcal vaccine: up to date Tdap vaccine: up to date Shingles vaccine: Due-May obtain vaccine at your local pharmacy.    Covid-19:completed  Advanced directives: yes, not on file  Conditions/risks identified: see problem list   Next appointment: Follow up in one year for your annual wellness visit    Preventive Care 65 Years and Older, Female Preventive care refers to lifestyle choices and visits with your health care provider that can promote health and wellness. What does preventive care include? A yearly physical exam. This is also called an annual well check. Dental exams once or twice a year. Routine eye exams. Ask your health care provider how often you should have your eyes checked. Personal lifestyle choices, including: Daily care of your teeth and gums. Regular physical activity. Eating a healthy diet. Avoiding tobacco and drug use. Limiting alcohol use. Practicing safe sex. Taking low-dose aspirin every day. Taking vitamin and mineral supplements as recommended by your health care provider. What happens during an annual well check? The services and screenings done by your health care provider during your annual well check will depend on your age, overall health, lifestyle risk factors, and family history of disease. Counseling  Your health care provider may ask you questions about your: Alcohol use. Tobacco  use. Drug use. Emotional well-being. Home and relationship well-being. Sexual activity. Eating habits. History of falls. Memory and ability to understand (cognition). Work and work Astronomer. Reproductive health. Screening  You may have the following tests or measurements: Height, weight, and BMI. Blood pressure. Lipid and cholesterol levels. These may be checked every 5 years, or more frequently if you are over 12 years old. Skin check. Lung cancer screening. You may have this screening every year starting at age 36 if you have a 30-pack-year history of smoking and currently smoke or have quit within the past 15 years. Fecal occult blood test (FOBT) of the stool. You may have this test every year starting at age 58. Flexible sigmoidoscopy or colonoscopy. You may have a sigmoidoscopy every 5 years or a colonoscopy every 10 years starting at age 74. Hepatitis C blood test. Hepatitis B blood test. Sexually transmitted disease (STD) testing. Diabetes screening. This is done by checking your blood sugar (glucose) after you have not eaten for a while (fasting). You may have this done every 1-3 years. Bone density scan. This is done to screen for osteoporosis. You may have this done starting at age 65. Mammogram. This may be done every 1-2 years. Talk to your health care provider about how often you should have regular mammograms. Talk with your health care provider about your test results, treatment options, and if necessary, the need for more tests. Vaccines  Your health care provider may recommend certain vaccines, such as: Influenza vaccine. This is recommended every year. Tetanus, diphtheria, and acellular pertussis (Tdap, Td) vaccine. You may need a Td booster every 10 years. Zoster vaccine. You may need this after age 58. Pneumococcal 13-valent conjugate (PCV13) vaccine. One dose is recommended  after age 53. Pneumococcal polysaccharide (PPSV23) vaccine. One dose is recommended  after age 64. Talk to your health care provider about which screenings and vaccines you need and how often you need them. This information is not intended to replace advice given to you by your health care provider. Make sure you discuss any questions you have with your health care provider. Document Released: 01/16/2015 Document Revised: 09/09/2015 Document Reviewed: 10/21/2014 Elsevier Interactive Patient Education  2017 [place] Prevention in the Home Falls can cause injuries. They can happen to people of all ages. There are many things you can do to make your home safe and to help prevent falls. What can I do on the outside of my home? Regularly fix the edges of walkways and driveways and fix any cracks. Remove anything that might make you trip as you walk through a door, such as a raised step or threshold. Trim any bushes or trees on the path to your home. Use bright outdoor lighting. Clear any walking paths of anything that might make someone trip, such as rocks or tools. Regularly check to see if handrails are loose or broken. Make sure that both sides of any steps have handrails. Any raised decks and porches should have guardrails on the edges. Have any leaves, snow, or ice cleared regularly. Use sand or salt on walking paths during winter. Clean up any spills in your garage right away. This includes oil or grease spills. What can I do in the bathroom? Use night lights. Install grab bars by the toilet and in the tub and shower. Do not use towel bars as grab bars. Use non-skid mats or decals in the tub or shower. If you need to sit down in the shower, use a plastic, non-slip stool. Keep the floor dry. Clean up any water that spills on the floor as soon as it happens. Remove soap buildup in the tub or shower regularly. Attach bath mats securely with double-sided non-slip rug tape. Do not have throw rugs and other things on the floor that can make you trip. What can I do  in the bedroom? Use night lights. Make sure that you have a light by your bed that is easy to reach. Do not use any sheets or blankets that are too big for your bed. They should not hang down onto the floor. Have a firm chair that has side arms. You can use this for support while you get dressed. Do not have throw rugs and other things on the floor that can make you trip. What can I do in the kitchen? Clean up any spills right away. Avoid walking on wet floors. Keep items that you use a lot in easy-to-reach places. If you need to reach something above you, use a strong step stool that has a grab bar. Keep electrical cords out of the way. Do not use floor polish or wax that makes floors slippery. If you must use wax, use non-skid floor wax. Do not have throw rugs and other things on the floor that can make you trip. What can I do with my stairs? Do not leave any items on the stairs. Make sure that there are handrails on both sides of the stairs and use them. Fix handrails that are broken or loose. Make sure that handrails are as long as the stairways. Check any carpeting to make sure that it is firmly attached to the stairs. Fix any carpet that is loose or worn. Avoid having throw  rugs at the top or bottom of the stairs. If you do have throw rugs, attach them to the floor with carpet tape. Make sure that you have a light switch at the top of the stairs and the bottom of the stairs. If you do not have them, ask someone to add them for you. What else can I do to help prevent falls? Wear shoes that: Do not have high heels. Have rubber bottoms. Are comfortable and fit you well. Are closed at the toe. Do not wear sandals. If you use a stepladder: Make sure that it is fully opened. Do not climb a closed stepladder. Make sure that both sides of the stepladder are locked into place. Ask someone to hold it for you, if possible. Clearly mark and make sure that you can see: Any grab bars or  handrails. First and last steps. Where the edge of each step is. Use tools that help you move around (mobility aids) if they are needed. These include: Canes. Walkers. Scooters. Crutches. Turn on the lights when you go into a dark area. Replace any light bulbs as soon as they burn out. Set up your furniture so you have a clear path. Avoid moving your furniture around. If any of your floors are uneven, fix them. If there are any pets around you, be aware of where they are. Review your medicines with your doctor. Some medicines can make you feel dizzy. This can increase your chance of falling. Ask your doctor what other things that you can do to help prevent falls. This information is not intended to replace advice given to you by your health care provider. Make sure you discuss any questions you have with your health care provider. Document Released: 10/16/2008 Document Revised: 05/28/2015 Document Reviewed: 01/24/2014 Elsevier Interactive Patient Education  2017 [employer].

## 2021-08-30 ENCOUNTER — Encounter: Payer: Self-pay | Admitting: Family Medicine

## 2021-12-13 ENCOUNTER — Encounter: Payer: Self-pay | Admitting: Family Medicine

## 2021-12-13 ENCOUNTER — Ambulatory Visit (INDEPENDENT_AMBULATORY_CARE_PROVIDER_SITE_OTHER): Payer: Medicare Other | Admitting: Family Medicine

## 2021-12-13 VITALS — BP 128/78 | HR 64 | Temp 97.8°F | Ht 66.0 in | Wt 221.4 lb

## 2021-12-13 DIAGNOSIS — R5383 Other fatigue: Secondary | ICD-10-CM | POA: Diagnosis not present

## 2021-12-13 DIAGNOSIS — E890 Postprocedural hypothyroidism: Secondary | ICD-10-CM

## 2021-12-13 DIAGNOSIS — T7840XA Allergy, unspecified, initial encounter: Secondary | ICD-10-CM

## 2021-12-13 MED ORDER — EPINEPHRINE 0.3 MG/0.3ML IJ SOAJ: 0.3000 mg | INTRAMUSCULAR | 1 refills | Status: AC | PRN

## 2021-12-13 NOTE — Progress Notes (Signed)
Chief Complaint  Patient presents with   Follow-up    Thyroid check Frequent urination Itching (all over)    Subjective: Patient is a 81 y.o. female here for f/u.  Hypothyroidism Patient presents for follow-up of hypothyroidism.  Reports compliance with medication- levothyroxine 125 mcg/d. Current symptoms include: fatigue Denies: denies fatigue, weight changes, heat/cold intolerance, bowel/skin changes or CVS symptoms She believes her dose should be not significantly changed   Past Medical History:  Diagnosis Date   Thyroid disease     Objective: BP 128/78 (BP Location: Right Arm, Patient Position: Sitting, Cuff Size: Large)   Pulse 64   Temp 97.8 F (36.6 C) (Oral)   Ht 5\' 6"  (1.676 m)   Wt 221 lb 6 oz (100.4 kg)   SpO2 94%   BMI 35.73 kg/m  General: Awake, appears stated age Neck: Supple, symmetric Heart: RRR, no LE edema Lungs: CTAB, no rales, wheezes or rhonchi. No accessory muscle use Psych: Age appropriate judgment and insight, normal affect and mood  Assessment and Plan: Postablative hypothyroidism - Plan: TSH, T4, free  Fatigue, unspecified type - Plan: CBC, Comprehensive metabolic panel  Chronic, stable. Ck labs. Cont levothyroxine 125 mcg/d. Counseled on diet/exercise. F/u in 6 mo or prn.  The patient voiced understanding and agreement to the plan.  [place], DO 12/13/21  1:51 PM

## 2021-12-13 NOTE — Patient Instructions (Signed)
Give us 2-3 business days to get the results of your labs back.   Keep the diet clean and stay active.  Let us know if you need anything. 

## 2021-12-14 ENCOUNTER — Other Ambulatory Visit: Payer: Self-pay | Admitting: Family Medicine

## 2021-12-14 ENCOUNTER — Encounter: Payer: Self-pay | Admitting: Family Medicine

## 2021-12-14 ENCOUNTER — Other Ambulatory Visit (INDEPENDENT_AMBULATORY_CARE_PROVIDER_SITE_OTHER): Payer: Medicare Other

## 2021-12-14 DIAGNOSIS — R739 Hyperglycemia, unspecified: Secondary | ICD-10-CM | POA: Diagnosis not present

## 2021-12-14 DIAGNOSIS — E890 Postprocedural hypothyroidism: Secondary | ICD-10-CM

## 2021-12-14 DIAGNOSIS — E785 Hyperlipidemia, unspecified: Secondary | ICD-10-CM

## 2021-12-14 LAB — COMPREHENSIVE METABOLIC PANEL
ALT: 11 U/L (ref 0–35)
AST: 15 U/L (ref 0–37)
Albumin: 4 g/dL (ref 3.5–5.2)
Alkaline Phosphatase: 73 U/L (ref 39–117)
BUN: 19 mg/dL (ref 6–23)
CO2: 29 mEq/L (ref 19–32)
Calcium: 9.3 mg/dL (ref 8.4–10.5)
Chloride: 102 mEq/L (ref 96–112)
Creatinine, Ser: 0.98 mg/dL (ref 0.40–1.20)
GFR: 54.21 mL/min — ABNORMAL LOW (ref >60.00)
Glucose, Bld: 127 mg/dL — ABNORMAL HIGH (ref 70–99)
Potassium: 4 mEq/L (ref 3.5–5.1)
Sodium: 139 mEq/L (ref 135–145)
Total Bilirubin: 0.4 mg/dL (ref 0.2–1.2)
Total Protein: 6.2 g/dL (ref 6.0–8.3)

## 2021-12-14 LAB — CBC
HCT: 36.6 % (ref 36.0–46.0)
Hemoglobin: 12.6 g/dL (ref 12.0–15.0)
MCHC: 34.5 g/dL (ref 30.0–36.0)
MCV: 95 fl (ref 78.0–100.0)
Platelets: 180 10*3/uL (ref 150.0–400.0)
RBC: 3.85 Mil/uL — ABNORMAL LOW (ref 3.87–5.11)
RDW: 12.2 % (ref 11.5–15.5)
WBC: 5.7 10*3/uL (ref 4.0–10.5)

## 2021-12-14 LAB — TSH: TSH: 4.67 u[IU]/mL (ref 0.35–5.50)

## 2021-12-14 LAB — T4, FREE: Free T4: 0.76 ng/dL (ref 0.60–1.60)

## 2021-12-14 LAB — HEMOGLOBIN A1C: Hgb A1c MFr Bld: 5.7 % (ref 4.6–6.5)

## 2021-12-14 MED ORDER — SYNTHROID 125 MCG PO TABS: 125.0000 ug | ORAL_TABLET | Freq: Every day | ORAL | 3 refills | Status: DC

## 2021-12-14 MED ORDER — SYNTHROID 125 MCG PO TABS: 125.0000 ug | ORAL_TABLET | Freq: Every day | ORAL | 0 refills | Status: DC

## 2022-01-12 ENCOUNTER — Encounter: Payer: Self-pay | Admitting: Family Medicine

## 2022-01-13 ENCOUNTER — Ambulatory Visit
Admission: EM | Admit: 2022-01-13 | Discharge: 2022-01-13 | Disposition: A | Discharge status: Home / Self Care | Payer: Medicare Other | Attending: Family Medicine | Admitting: Family Medicine

## 2022-01-13 DIAGNOSIS — L0291 Cutaneous abscess, unspecified: Secondary | ICD-10-CM

## 2022-01-13 MED ORDER — HYDROCODONE-ACETAMINOPHEN 5-325 MG PO TABS: 1.0000 | ORAL_TABLET | Freq: Four times a day (QID) | ORAL | 0 refills | Status: DC | PRN

## 2022-01-13 MED ORDER — DOXYCYCLINE HYCLATE 100 MG PO CAPS: 100.0000 mg | ORAL_CAPSULE | Freq: Two times a day (BID) | ORAL | 0 refills | Status: DC

## 2022-01-13 NOTE — Discharge Instructions (Signed)
May continue warm compresses to area Take doxycycline 2 times a day.  It is important to take this antibiotic with food Take over-the-counter Tylenol or ibuprofen for moderate pain Take hydrocodone as needed for severe pain.  Take fiber (Metamucil, MiraLAX, or similar) while on hydrocodone.  Make sure you are drinking enough water Call your doctor if not improving by Monday

## 2022-01-13 NOTE — ED Triage Notes (Addendum)
Pt c/o abscess to mid back area x 1 month. No drainage but throbbing pain. Warm compresses prn. Aspirin prn.

## 2022-01-13 NOTE — ED Provider Notes (Signed)
[facility]    CSN: [id number] Arrival date & time: 01/13/22  1014      History   Chief Complaint Chief Complaint  Patient presents with   Abscess    Mid-back    HPI Melanie Holmes is a 82 y.o. female.   HPI  This is a very pleasant 82 year old woman who is here complaining of an infection on her back.  She states that she knows that she has an infection because of swelling and pain.  She states that she has had these before.  She states they usually manage them at home.  This time, however, she is having increasing pain over a 1 month period of time.  She feels like the area is swollen.  There is no drainage.  There is no redness.  There is no palpable lump.  She has been using warm compresses.  Over-the-counter pain medicine.  She states the pain is so severe it is keeping her awake at night.  He states that when she is trying to sleep even taking a deep breath will increase the pain.  Otherwise is not having any trouble breathing, cough, fever or chills or signs of infection.  Past Medical History:  Diagnosis Date   Thyroid disease     Patient Active Problem List   Diagnosis Date Noted   Pruritus 04/22/2020   Pollen allergy 04/22/2020   Other allergic rhinitis 04/22/2020   Postablative hypothyroidism 08/21/2018    Past Surgical History:  Procedure Laterality Date   ABDOMINAL HYSTERECTOMY  1988   With ovaries   BREAST BIOPSY     1961 left side tumor removed, 1983 right side cyst removed   CHOLECYSTECTOMY  1976   THYROID SURGERY  1989   "had hot spot removed"   TONSILLECTOMY  1947    OB History   No obstetric history on file.      Home Medications    Prior to Admission medications   Medication Sig Start Date End Date Taking? Authorizing Provider  doxycycline (VIBRAMYCIN) 100 MG capsule Take 1 capsule (100 mg total) by mouth 2 (two) times daily. 01/13/22  Yes Eustace Moore, MD  HYDROcodone-acetaminophen (NORCO/VICODIN) 5-325 MG tablet Take 1-2  tablets by mouth every 6 (six) hours as needed. 01/13/22  Yes Eustace Moore, MD  EPINEPHrine (EPIPEN 2-PAK) 0.3 mg/0.3 mL IJ SOAJ injection Inject 0.3 mg into the muscle as needed for anaphylaxis. 12/13/21   Sharlene Dory, DO  magnesium oxide (MAG-OX) 400 MG tablet Take 400 mg by mouth daily.     [provider]  Multiple Vitamin (MULTI-VITAMIN) tablet 1 tablet daily.     [provider]  SYNTHROID 125 MCG tablet Take 1 tablet (125 mcg total) by mouth daily before breakfast. 12/14/21   [place], Jilda Roche, DO    Family History Family History  Problem Relation Age of Onset   Colon cancer Mother    Esophageal cancer Father    Allergic rhinitis Neg Hx    Angioedema Neg Hx    Asthma Neg Hx    Eczema Neg Hx    Immunodeficiency Neg Hx    Urticaria Neg Hx     Social History Social History   Tobacco Use   Smoking status: Former    Types: Cigarettes    Quit date: 08/11/1978    Years since quitting: 43.4   Smokeless tobacco: Never   Tobacco comments:    stopped 40 years ago-06/12/18  Vaping Use   Vaping Use:  Never used  Substance Use Topics   Alcohol use: Not Currently   Drug use: Never     Allergies   Baclofen, Clonidine derivatives, Metoprolol, Rosuvastatin, Simvastatin, Adhesive [tape], Morphine and related, and Penicillins   Review of Systems Review of Systems See HPI  Physical Exam Triage Vital Signs ED Triage Vitals  Enc Vitals Group     BP 01/13/22 1029 (!) 148/102     Pulse Rate 01/13/22 1022 76     Resp 01/13/22 1022 17     Temp 01/13/22 1022 97.6 F (36.4 C)     Temp Source 01/13/22 1022 Oral     SpO2 01/13/22 1022 97 %     Weight --      Height --      Head Circumference --      Peak Flow --      Pain Score 01/13/22 1022 6     Pain Loc --      Pain Edu? --      Excl. in [place]? --    No data found.  Updated Vital Signs BP (!) 148/102 (BP Location: Right Arm)   Pulse 76   Temp 97.6 F (36.4 C) (Oral)   Resp 17    SpO2 97%        Physical Exam Constitutional:      General: She is not in acute distress.    Appearance: She is well-developed.     Comments: Appears uncomfortable  HENT:     Head: Normocephalic and atraumatic.  Eyes:     Conjunctiva/sclera: Conjunctivae normal.     Pupils: Pupils are equal, round, and reactive to light.  Cardiovascular:     Rate and Rhythm: Normal rate and regular rhythm.     Heart sounds: Normal heart sounds.  Pulmonary:     Effort: Pulmonary effort is normal. No respiratory distress.     Breath sounds: Normal breath sounds.    Chest:     Chest wall: No tenderness.  Abdominal:     General: There is no distension.     Palpations: Abdomen is soft.  Musculoskeletal:        General: Normal range of motion.     Cervical back: Normal range of motion.  Skin:    General: Skin is warm and dry.  Neurological:     Mental Status: She is alert.      UC Treatments / Results  Labs (all labs ordered are listed, but only abnormal results are displayed) Labs Reviewed - No data to display  EKG   Radiology No results found.  Procedures Procedures (including critical care time)  Medications Ordered in UC Medications - No data to display  Initial Impression / Assessment and Plan / UC Course  I have reviewed the triage vital signs and the nursing notes.  Pertinent labs & imaging results that were available during my care of the patient were reviewed by me and considered in my medical decision making (see chart for details).    Patient has an ill-defined area of pain.  Perhaps some soft tissue swelling but no induration, erythema.  Mild warmth.  Will treat with antibiotics and follow-up with primary care.  Norco for pain with caution avoid driving.  Prevent constipation. Final Clinical Impressions(s) / UC Diagnoses   Final diagnoses:  Abscess     Discharge Instructions      May continue warm compresses to area Take doxycycline 2 times a day.  It is  important to take  this antibiotic with food Take over-the-counter Tylenol or ibuprofen for moderate pain Take hydrocodone as needed for severe pain.  Take fiber (Metamucil, MiraLAX, or similar) while on hydrocodone.  Make sure you are drinking enough water Call your doctor if not improving by Monday   ED Prescriptions     Medication Sig Dispense Auth. Provider   doxycycline (VIBRAMYCIN) 100 MG capsule Take 1 capsule (100 mg total) by mouth 2 (two) times daily. 14 capsule Raylene Everts, MD   HYDROcodone-acetaminophen (NORCO/VICODIN) 5-325 MG tablet Take 1-2 tablets by mouth every 6 (six) hours as needed. 10 tablet Raylene Everts, MD      I have reviewed the PDMP during this encounter.   Raylene Everts, MD 01/13/22 1116

## 2022-01-18 ENCOUNTER — Encounter: Payer: Self-pay | Admitting: Family Medicine

## 2022-01-18 ENCOUNTER — Ambulatory Visit (INDEPENDENT_AMBULATORY_CARE_PROVIDER_SITE_OTHER): Payer: Medicare Other | Admitting: Family Medicine

## 2022-01-18 VITALS — BP 128/80 | HR 54 | Temp 97.9°F | Ht 66.0 in | Wt 221.0 lb

## 2022-01-18 DIAGNOSIS — B029 Zoster without complications: Secondary | ICD-10-CM | POA: Diagnosis not present

## 2022-01-18 MED ORDER — VALACYCLOVIR HCL 1 G PO TABS: 1000.0000 mg | ORAL_TABLET | Freq: Three times a day (TID) | ORAL | 0 refills | Status: AC

## 2022-01-18 MED ORDER — GABAPENTIN 100 MG PO CAPS: 100.0000 mg | ORAL_CAPSULE | Freq: Every day | ORAL | 0 refills | Status: DC

## 2022-01-18 NOTE — Patient Instructions (Addendum)
OK to take Tylenol 1000 mg (2 extra strength tabs) or 975 mg (3 regular strength tabs) every 6 hours as needed.  Ice/cold pack over area for 10-15 min twice daily.  Consider taking Pepcid (famotidine) 20 mg daily for stomach issues caused by the Excedrin. This is available OTC.   Let me know if there are any issues.   The nightly medicine can make you drowsy.

## 2022-01-18 NOTE — Progress Notes (Signed)
Chief Complaint  Patient presents with   Herpes Zoster    Melanie Holmes is a 82 y.o. female here for a skin complaint.  Duration: 8 days Location: L upper back Pruritic? No Painful? Yes - "hot poker" Drainage? No New soaps/lotions/topicals/detergents? No Sick contacts? No Other associated symptoms: seems to be spreading, no fevers Therapies tried thus far: doxy helped an abscess, Excedrin helped, Tylenol did not  Past Medical History:  Diagnosis Date   Thyroid disease     BP 128/80 (BP Location: Right Arm, Patient Position: Sitting, Cuff Size: Normal)   Pulse (!) 54   Temp 97.9 F (36.6 C) (Oral)   Ht 5\' 6"  (1.676 m)   Wt 221 lb (100.2 kg)   SpO2 99%   BMI 35.67 kg/m  Gen: awake, alert, appearing stated age Lungs: No accessory muscle use Skin: See below. No drainage Psych: Age appropriate judgment and insight   L upper back  Herpes zoster without complication - Plan: valACYclovir (VALTREX) 1000 MG tablet, gabapentin (NEURONTIN) 100 MG capsule  Gabapentin only at night. Excedrin during the day. 7 d of Valtrex, explained this may be less efficacious given duration of her symptoms. Could consider Elavil 10 mg qhs if gabapentin not helpful as it is affecting her sleep.  F/u prn. The patient voiced understanding and agreement to the plan.  [place], DO 01/18/22 3:10 PM

## 2022-01-19 ENCOUNTER — Encounter: Payer: Self-pay | Admitting: Family Medicine

## 2022-01-19 ENCOUNTER — Other Ambulatory Visit: Payer: Self-pay | Admitting: Family Medicine

## 2022-01-19 MED ORDER — AMITRIPTYLINE HCL 10 MG PO TABS: 10.0000 mg | ORAL_TABLET | Freq: Every day | ORAL | 0 refills | Status: DC

## 2022-01-20 ENCOUNTER — Encounter: Payer: Self-pay | Admitting: Family Medicine

## 2022-02-14 ENCOUNTER — Ambulatory Visit (INDEPENDENT_AMBULATORY_CARE_PROVIDER_SITE_OTHER): Payer: Medicare Other | Admitting: Family Medicine

## 2022-02-14 ENCOUNTER — Encounter: Payer: Self-pay | Admitting: Family Medicine

## 2022-02-14 VITALS — BP 130/80 | HR 72 | Temp 97.7°F | Ht 66.5 in | Wt 223.2 lb

## 2022-02-14 DIAGNOSIS — R202 Paresthesia of skin: Secondary | ICD-10-CM

## 2022-02-14 DIAGNOSIS — R200 Anesthesia of skin: Secondary | ICD-10-CM

## 2022-02-14 NOTE — Patient Instructions (Addendum)
Please get a wrist cock up splint/carpal tunnel brace at the pharmacy.  Heat (pad or rice pillow in microwave) over affected area, 10-15 minutes twice daily.   Ice/cold pack over area for 10-15 min twice daily.  Wear the brace at night and during aggravating activities.  Send me a message if no better in the next few weeks.   Let us know if you need anything.

## 2022-02-14 NOTE — Progress Notes (Signed)
Chief Complaint  Patient presents with   Numbness    Right hand     Subjective: Patient is a 82 y.o. female here for R hand numbness.  R middle finger has been intermittently numb for several mo. Then the R thumb would also get numb. Over the past 2 weeks, the entire hand has been numb. It is very bothersome at night. This also comes and goes. Getting worse over past 4 d. No fevers, skin changes, bruising, decreased ROM, weakness/dropping things. Better when she is moving around.   Past Medical History:  Diagnosis Date   Thyroid disease     Objective: BP 130/80 (BP Location: Right Arm, Patient Position: Sitting, Cuff Size: Normal)   Pulse 72   Temp 97.7 F (36.5 C) (Oral)   Ht 5' 6.5" (1.689 m)   Wt 223 lb 4 oz (101.3 kg)   SpO2 97%   BMI 35.49 kg/m  General: Awake, appears stated age Heart: Brisk cap refill Neuro: Grip strength adequate. Neg Tinel's, Spurling's, Phalen's.  MSK: No ttp over hand or trap on R Lungs: No accessory muscle use Psych: Age appropriate judgment and insight, normal affect and mood  Assessment and Plan: Numbness and tingling in right hand  Wear a wrist brace at night and during aggravating activity. Sports med if no better. She will send a message. Tylenol prn.  The patient voiced understanding and agreement to the plan.  [place], DO 02/14/22  2:42 PM

## 2022-02-15 ENCOUNTER — Encounter: Payer: Self-pay | Admitting: Family Medicine

## 2022-03-02 ENCOUNTER — Other Ambulatory Visit: Payer: Self-pay | Admitting: Family Medicine

## 2022-03-02 DIAGNOSIS — E890 Postprocedural hypothyroidism: Secondary | ICD-10-CM

## 2022-04-24 ENCOUNTER — Encounter: Payer: Self-pay | Admitting: Family Medicine

## 2022-06-01 ENCOUNTER — Encounter: Payer: Self-pay | Admitting: Family Medicine

## 2022-06-06 ENCOUNTER — Ambulatory Visit (INDEPENDENT_AMBULATORY_CARE_PROVIDER_SITE_OTHER): Payer: Medicare Other | Admitting: Family Medicine

## 2022-06-06 ENCOUNTER — Encounter: Payer: Self-pay | Admitting: Family Medicine

## 2022-06-06 VITALS — BP 152/80 | HR 69 | Temp 97.6°F | Ht 66.5 in | Wt 219.0 lb

## 2022-06-06 DIAGNOSIS — R0789 Other chest pain: Secondary | ICD-10-CM | POA: Diagnosis not present

## 2022-06-06 DIAGNOSIS — R5383 Other fatigue: Secondary | ICD-10-CM

## 2022-06-06 NOTE — Patient Instructions (Addendum)
Give [place] 2-3 business days to get the results of your labs back.   EKG is reassuring. If you have any symptoms similar to the Thursday incident, let me know.   If you do not hear anything about your referral in the next 1-2 weeks, call our office and ask for an update.  Keep the diet clean and stay active.  Let us know if you need anything.

## 2022-06-06 NOTE — Progress Notes (Signed)
Chief Complaint  Patient presents with   Fatigue    Back problems Left side Neck/jaw and arm pain (only lasted a couple minutes).    Subjective: Patient is a 82 y.o. female here for fatigue.  4 days ago, the patient was watching a comedy on TV, sitting down, and experienced left-sided sharp chest pain rating to her left jaw and left upper extremity.  It lasted for a few minutes and spontaneously resolved.  She was not physically exerting herself or laughing particular heart at the time.  She has not had any issues since then.  She does not exercise routinely.  When she does physically exert herself, she has not had a recurrence of the symptoms either.  Over the past few months, the patient has been having worsening daytime fatigue and sleepiness.  She sleeps around 8 hours per night and wakes up feeling that she has not slept at all.  She does snore routinely.  She will sometimes nap throughout the day.  Past Medical History:  Diagnosis Date   Thyroid disease     Objective: BP (!) 152/80 (BP Location: Right Arm, Cuff Size: Large)   Pulse 69   Temp 97.6 F (36.4 C) (Oral)   Ht 5' 6.5" (1.689 m)   Wt 219 lb (99.3 kg)   SpO2 99%   BMI 34.82 kg/m  General: Awake, appears stated age Mouth: Mallampati 4, MMM Heart: RRR Lungs: CTAB, no rales, wheezes or rhonchi. No accessory muscle use Psych: Age appropriate judgment and insight, normal affect and mood  Assessment and Plan: Fatigue, unspecified type - Plan: CBC, Comprehensive metabolic panel, TSH, Ambulatory referral to Neurology  Atypical chest pain - Plan: EKG 12-Lead  Refer to neurology for possible sleep apnea.  Check above labs.  Mood seems less of a concern for this. EKG shows sinus bradycardia, normal axis, no interval abnormalities, no ST segment or T wave changes, good R wave progression.  Reassurance for this at this time. The patient voiced understanding and agreement to the plan.  Jilda Roche [place], DO 06/06/22   4:42 PM

## 2022-06-07 ENCOUNTER — Encounter: Payer: Self-pay | Admitting: Family Medicine

## 2022-06-07 ENCOUNTER — Other Ambulatory Visit: Payer: Self-pay | Admitting: Family Medicine

## 2022-06-07 ENCOUNTER — Ambulatory Visit (INDEPENDENT_AMBULATORY_CARE_PROVIDER_SITE_OTHER): Payer: Medicare Other

## 2022-06-07 DIAGNOSIS — R5383 Other fatigue: Secondary | ICD-10-CM

## 2022-06-07 LAB — CBC
HCT: 39 % (ref 36.0–46.0)
Hemoglobin: 13.1 g/dL (ref 12.0–15.0)
MCHC: 33.7 g/dL (ref 30.0–36.0)
MCV: 96 fl (ref 78.0–100.0)
Platelets: 180 10*3/uL (ref 150.0–400.0)
RBC: 4.06 Mil/uL (ref 3.87–5.11)
RDW: 12.4 % (ref 11.5–15.5)
WBC: 5.2 10*3/uL (ref 4.0–10.5)

## 2022-06-07 LAB — COMPREHENSIVE METABOLIC PANEL
ALT: 11 U/L (ref 0–35)
AST: 17 U/L (ref 0–37)
Albumin: 4.1 g/dL (ref 3.5–5.2)
Alkaline Phosphatase: 69 U/L (ref 39–117)
BUN: 18 mg/dL (ref 6–23)
CO2: 33 mEq/L — ABNORMAL HIGH (ref 19–32)
Calcium: 9.4 mg/dL (ref 8.4–10.5)
Chloride: 101 mEq/L (ref 96–112)
Creatinine, Ser: 0.97 mg/dL (ref 0.40–1.20)
GFR: 54.7 mL/min — ABNORMAL LOW (ref >60.00)
Glucose, Bld: 92 mg/dL (ref 70–99)
Potassium: 4.4 mEq/L (ref 3.5–5.1)
Sodium: 140 mEq/L (ref 135–145)
Total Bilirubin: 0.4 mg/dL (ref 0.2–1.2)
Total Protein: 6.5 g/dL (ref 6.0–8.3)

## 2022-06-07 LAB — T4, FREE: Free T4: 0.75 ng/dL (ref 0.60–1.60)

## 2022-06-07 LAB — TSH: TSH: 7.38 u[IU]/mL — ABNORMAL HIGH (ref 0.35–5.50)

## 2022-06-14 ENCOUNTER — Ambulatory Visit (INDEPENDENT_AMBULATORY_CARE_PROVIDER_SITE_OTHER): Payer: Medicare Other | Admitting: *Deleted

## 2022-06-14 DIAGNOSIS — Z0000 Encounter for general adult medical examination without abnormal findings: Secondary | ICD-10-CM | POA: Diagnosis not present

## 2022-06-14 NOTE — Progress Notes (Signed)
Subjective:  Pt completed ADLs, Fall risk, and SDOH during e-check in on 06/07/22.  Answers verified with pt.    Melanie Holmes is a 82 y.o. female who presents for Medicare Annual (Subsequent) preventive examination.  I connected with  Melanie Holmes on 06/14/22 by a audio enabled telemedicine application and verified that I am speaking with the correct person using two identifiers.  Patient Location: Home  Provider Location: Office/Clinic  I discussed the limitations of evaluation and management by telemedicine. The patient expressed understanding and agreed to proceed.   Review of Systems     Cardiac Risk Factors include: advanced age (>45men, >18 women);hypertension     Objective:    Today's Vitals   There is no height or weight on file to calculate BMI.     06/14/2022    1:01 PM 06/04/2021   11:02 AM 05/08/2019    8:59 PM 05/05/2019    4:03 PM  Advanced Directives  Does Patient Have a Medical Advance Directive? Yes Yes No No  Type of Estate agent of [place];Living will Healthcare Power of [place];Out of facility DNR (pink MOST or yellow form);Living will    Does patient want to make changes to medical advance directive? No - Patient declined No - Patient declined    Copy of Healthcare Power of Attorney in Chart? No - copy requested No - copy requested    Would patient like information on creating a medical advance directive?    No - Patient declined    Current Medications (verified) Outpatient Encounter Medications as of 06/14/2022  Medication Sig   EPINEPHrine (EPIPEN 2-PAK) 0.3 mg/0.3 mL IJ SOAJ injection Inject 0.3 mg into the muscle as needed for anaphylaxis.   magnesium oxide (MAG-OX) 400 MG tablet Take 400 mg by mouth daily.    Multiple Vitamin (MULTI-VITAMIN) tablet 1 tablet daily.    SYNTHROID 125 MCG tablet TAKE 1 TABLET DAILY BEFORE BREAKFAST   No facility-administered encounter medications on file as of 06/14/2022.    Allergies  (verified) Baclofen, Clonidine derivatives, Metoprolol, Rosuvastatin, Simvastatin, Adhesive [tape], Morphine and codeine, and Penicillins   History: Past Medical History:  Diagnosis Date   Allergy 2022   Cancer [facility]) 2023   Surgery on scalp   Cataract 2010   Surgery on both eyes   Heart murmur 1960   Hypertension    Thyroid disease    Past Surgical History:  Procedure Laterality Date   ABDOMINAL HYSTERECTOMY  1988   With ovaries   BREAST BIOPSY     1961 left side tumor removed, 1983 right side cyst removed   CHOLECYSTECTOMY  1976   EYE SURGERY  2012   Surgery both eyes   THYROID SURGERY  1989   "had hot spot removed"   TONSILLECTOMY  1947   Family History  Problem Relation Age of Onset   Colon cancer Mother    Esophageal cancer Father    Allergic rhinitis Neg Hx    Angioedema Neg Hx    Asthma Neg Hx    Eczema Neg Hx    Immunodeficiency Neg Hx    Urticaria Neg Hx    Social History   Socioeconomic History   Marital status: Married    Spouse name: Not on file   Number of children: Not on file   Years of education: Not on file   Highest education level: 12th grade  Occupational History   Not on file  Tobacco Use   Smoking status: Former  Types: Cigarettes    Quit date: 08/11/1978    Years since quitting: 43.8   Smokeless tobacco: Never   Tobacco comments:    stopped 40 years ago-06/12/18  Vaping Use   Vaping Use: Never used  Substance and Sexual Activity   Alcohol use: Not Currently   Drug use: Never   Sexual activity: Yes    Birth control/protection: None  Other Topics Concern   Not on file  Social History Narrative   Not on file   Social Determinants of Health   Financial Resource Strain: Low Risk  (06/07/2022)   Overall Financial Resource Strain (CARDIA)    Difficulty of Paying Living Expenses: Not hard at all  Food Insecurity: No Food Insecurity (06/07/2022)   Hunger Vital Sign    Worried About Running Out of Food in the Last Year: Never true     Ran Out of Food in the Last Year: Never true  Transportation Needs: No Transportation Needs (06/07/2022)   PRAPARE - Administrator, Civil Service (Medical): No    Lack of Transportation (Non-Medical): No  Physical Activity: Sufficiently Active (06/07/2022)   Exercise Vital Sign    Days of Exercise per Week: 5 days    Minutes of Exercise per Session: 30 min  Recent Concern: Physical Activity - Insufficiently Active (06/03/2022)   Exercise Vital Sign    Days of Exercise per Week: 5 days    Minutes of Exercise per Session: 20 min  Stress: No Stress Concern Present (06/07/2022)   Melanie Holmes of Occupational Health - Occupational Stress Questionnaire    Feeling of Stress : Not at all  Social Connections: Moderately Isolated (06/07/2022)   Social Connection and Isolation Panel [NHANES]    Frequency of Communication with Friends and Family: Three times a week    Frequency of Social Gatherings with Friends and Family: Twice a week    Attends Religious Services: Never    Database administrator or Organizations: No    Attends Engineer, structural: Never    Marital Status: Married    Tobacco Counseling Counseling given: Not Answered Tobacco comments: stopped 40 years ago-06/12/18   Clinical Intake:  Pre-visit preparation completed: Yes  Pain : No/denies pain  Nutritional Risks: None Diabetes: No  How often do you need to have someone help you when you read instructions, pamphlets, or other written materials from your doctor or pharmacy?: 1 - Never   Activities of Daily Living    06/07/2022    3:26 PM  In your present state of health, do you have any difficulty performing the following activities:  Hearing? 1  Comment some slight hearing loss, worse in left ear  Vision? 0  Difficulty concentrating or making decisions? 1  Walking or climbing stairs? 0  Dressing or bathing? 0  Doing errands, shopping? 0  Preparing Food and eating ? N  Using the Toilet? N  In  the past six months, have you accidently leaked urine? Y  Do you have problems with loss of bowel control? N  Managing your Medications? N  Managing your Finances? N  Housekeeping or managing your Housekeeping? N    Patient Care Team: Sharlene Dory, DO as PCP - General (Family Medicine)  Indicate any recent Medical Services you may have received from other than [facility] providers in the past year (date may be approximate).     Assessment:   This is a routine wellness examination for Melanie Holmes.  Hearing/Vision screen No results  found.  Dietary issues and exercise activities discussed: Current Exercise Habits: Home exercise routine, Type of exercise: walking;treadmill, Time (Minutes): 30, Frequency (Times/Week): 5, Weekly Exercise (Minutes/Week): 150, Intensity: Mild, Exercise limited by: None identified   Goals Addressed   None    Depression Screen    06/14/2022    1:03 PM 12/13/2021    1:37 PM 06/04/2021   11:04 AM 06/04/2021   11:03 AM 10/28/2019    1:00 PM  PHQ 2/9 Scores  PHQ - 2 Score 3 0 0 0 0  PHQ- 9 Score  0       Fall Risk    06/07/2022    3:26 PM 01/18/2022    3:05 PM 12/13/2021    1:36 PM 06/04/2021   11:02 AM  Fall Risk   Falls in the past year? 0 0 0 0  Number falls in past yr: 0 0 0 0  Injury with Fall? 0 0 0 0  Risk for fall due to : No Fall Risks No Fall Risks No Fall Risks No Fall Risks  Follow up Falls evaluation completed  Falls evaluation completed Falls evaluation completed    FALL RISK PREVENTION PERTAINING TO THE HOME:  Any stairs in or around the home? Yes  If so, are there any without handrails? No  Home free of loose throw rugs in walkways, pet beds, electrical cords, etc? Yes  Adequate lighting in your home to reduce risk of falls? Yes   ASSISTIVE DEVICES UTILIZED TO PREVENT FALLS:  Life alert? No  Use of a cane, walker or w/c? No  Grab bars in the bathroom? Yes  Shower chair or bench in shower? No  Elevated toilet seat or a  handicapped toilet?  Comfort height  TIMED UP AND GO:  Was the test performed?  No, audio visit .    Cognitive Function:        06/14/2022    1:13 PM 06/04/2021   11:09 AM  6CIT Screen  What Year? 0 points 0 points  What month? 0 points 0 points  What time? 0 points 0 points  Count back from 20 0 points 0 points  Months in reverse 0 points 0 points  Repeat phrase 0 points 0 points  Total Score 0 points 0 points    Immunizations Immunization History  Administered Date(s) Administered   Influenza, High Dose Seasonal PF 10/07/2015, 09/18/2018, 09/21/2020, 08/23/2021   Influenza-Unspecified 10/08/2014, 09/19/2017, 09/12/2019   Moderna Covid-19 Vaccine Bivalent Booster 58yrs & up 11/10/2020   Moderna Sars-Covid-2 Vaccination 02/16/2019, 03/16/2019, 11/04/2019, 04/07/2020   PPD Test 05/20/2009, 06/03/2009   Pneumococcal Conjugate-13 05/03/2010   Pneumococcal Polysaccharide-23 01/14/2014   Td 11/12/2008, 10/23/2018    TDAP status: Up to date  Flu Vaccine status: Up to date  Pneumococcal vaccine status: Up to date  Covid-19 vaccine status: Information provided on how to obtain vaccines.   Qualifies for Shingles Vaccine? Yes   Zostavax completed No   Shingrix Completed?: No.    Education has been provided regarding the importance of this vaccine. Patient has been advised to call insurance company to determine out of pocket expense if they have not yet received this vaccine. Advised may also receive vaccine at local pharmacy or Health Dept. Verbalized acceptance and understanding.  Screening Tests Health Maintenance  Topic Date Due   Zoster Vaccines- Shingrix (1 of 2) Never done   COVID-19 Vaccine (6 - 2023-24 season) 09/03/2021   Medicare Annual Wellness (AWV)  06/05/2022   INFLUENZA VACCINE  08/04/2022   DTaP/Tdap/Td (3 - Tdap) 10/22/2028   Pneumonia Vaccine 51+ Years old  Completed   DEXA SCAN  Completed   HPV VACCINES  Aged Out    Health Maintenance  Health  Maintenance Due  Topic Date Due   Zoster Vaccines- Shingrix (1 of 2) Never done   COVID-19 Vaccine (6 - 2023-24 season) 09/03/2021   Medicare Annual Wellness (AWV)  06/05/2022    Colorectal cancer screening: No longer required.   Mammogram status: No longer required due to age.  Bone Density status: Completed 10/31/19. Results reflect: Bone density results: NORMAL. Repeat every 2 years.  Lung Cancer Screening: (Low Dose CT Chest recommended if Age 91-80 years, 30 pack-year currently smoking OR have quit w/in 15years.) does not qualify.   Additional Screening:  Hepatitis C Screening: does not qualify  Vision Screening: Recommended annual ophthalmology exams for early detection of glaucoma and other disorders of the eye. Is the patient up to date with their annual eye exam?  Yes  Who is the provider or what is the name of the office in which the patient attends annual eye exams? [facility]  If pt is not established with a provider, would they like to be referred to a provider to establish care? No .   Dental Screening: Recommended annual dental exams for proper oral hygiene  Community Resource Referral / Chronic Care Management: CRR required this visit?  No   CCM required this visit?  No      Plan:     I have personally reviewed and noted the following in the patient's chart:   Medical and social history Use of alcohol, tobacco or illicit drugs  Current medications and supplements including opioid prescriptions. Patient is not currently taking opioid prescriptions. Functional ability and status Nutritional status Physical activity Advanced directives List of other physicians Hospitalizations, surgeries, and ER visits in previous 12 months Vitals Screenings to include cognitive, depression, and falls Referrals and appointments  In addition, I have reviewed and discussed with patient certain preventive protocols, quality metrics, and best practice recommendations. A  written personalized care plan for preventive services as well as general preventive health recommendations were provided to patient.   Due to this being a telephonic visit, the after visit summary with patients personalized plan was offered to patient via mail or my-chart. Patient would like to access on my-chart.  Donne Anon, [place]   06/14/2022   Nurse Notes: None

## 2022-06-14 NOTE — Patient Instructions (Signed)
[name] , Thank you for taking time to come for your Medicare Wellness Visit. I appreciate your ongoing commitment to your health goals. Please review the following plan we discussed and let me know if I can assist you in the future.   These are the goals we discussed:  Goals   None     This is a list of the screening recommended for you and due dates:  Health Maintenance  Topic Date Due   Zoster (Shingles) Vaccine (1 of 2) Never done   COVID-19 Vaccine (6 - 2023-24 season) 09/03/2021   Flu Shot  08/04/2022   Medicare Annual Wellness Visit  06/14/2023   DTaP/Tdap/Td vaccine (3 - Tdap) 10/22/2028   Pneumonia Vaccine  Completed   DEXA scan (bone density measurement)  Completed   HPV Vaccine  Aged Out     Next appointment: Follow up in one year for your annual wellness visit.   Preventive Care 82 Years and Older, Female Preventive care refers to lifestyle choices and visits with your health care provider that can promote health and wellness. What does preventive care include? A yearly physical exam. This is also called an annual well check. Dental exams once or twice a year. Routine eye exams. Ask your health care provider how often you should have your eyes checked. Personal lifestyle choices, including: Daily care of your teeth and gums. Regular physical activity. Eating a healthy diet. Avoiding tobacco and drug use. Limiting alcohol use. Practicing safe sex. Taking low-dose aspirin every day. Taking vitamin and mineral supplements as recommended by your health care provider. What happens during an annual well check? The services and screenings done by your health care provider during your annual well check will depend on your age, overall health, lifestyle risk factors, and family history of disease. Counseling  Your health care provider may ask you questions about your: Alcohol use. Tobacco use. Drug use. Emotional well-being. Home and relationship  well-being. Sexual activity. Eating habits. History of falls. Memory and ability to understand (cognition). Work and work Astronomer. Reproductive health. Screening  You may have the following tests or measurements: Height, weight, and BMI. Blood pressure. Lipid and cholesterol levels. These may be checked every 5 years, or more frequently if you are over 82 years old. Skin check. Lung cancer screening. You may have this screening every year starting at age 82 if you have a 30-pack-year history of smoking and currently smoke or have quit within the past 15 years. Fecal occult blood test (FOBT) of the stool. You may have this test every year starting at age 82. Flexible sigmoidoscopy or colonoscopy. You may have a sigmoidoscopy every 5 years or a colonoscopy every 10 years starting at age 82. Hepatitis C blood test. Hepatitis B blood test. Sexually transmitted disease (STD) testing. Diabetes screening. This is done by checking your blood sugar (glucose) after you have not eaten for a while (fasting). You may have this done every 1-3 years. Bone density scan. This is done to screen for osteoporosis. You may have this done starting at age 82. Mammogram. This may be done every 1-2 years. Talk to your health care provider about how often you should have regular mammograms. Talk with your health care provider about your test results, treatment options, and if necessary, the need for more tests. Vaccines  Your health care provider may recommend certain vaccines, such as: Influenza vaccine. This is recommended every year. Tetanus, diphtheria, and acellular pertussis (Tdap, Td) vaccine. You may need a  Td booster every 10 years. Zoster vaccine. You may need this after age 20. Pneumococcal 13-valent conjugate (PCV13) vaccine. One dose is recommended after age 82. Pneumococcal polysaccharide (PPSV23) vaccine. One dose is recommended after age 82. Talk to your health care provider about which  screenings and vaccines you need and how often you need them. This information is not intended to replace advice given to you by your health care provider. Make sure you discuss any questions you have with your health care provider. Document Released: 01/16/2015 Document Revised: 09/09/2015 Document Reviewed: 10/21/2014 Elsevier Interactive Patient Education  2017 [employer].  Fall Prevention in the Home Falls can cause injuries. They can happen to people of all ages. There are many things you can do to make your home safe and to help prevent falls. What can I do on the outside of my home? Regularly fix the edges of walkways and driveways and fix any cracks. Remove anything that might make you trip as you walk through a door, such as a raised step or threshold. Trim any bushes or trees on the path to your home. Use bright outdoor lighting. Clear any walking paths of anything that might make someone trip, such as rocks or tools. Regularly check to see if handrails are loose or broken. Make sure that both sides of any steps have handrails. Any raised decks and porches should have guardrails on the edges. Have any leaves, snow, or ice cleared regularly. Use sand or salt on walking paths during winter. Clean up any spills in your garage right away. This includes oil or grease spills. What can I do in the bathroom? Use night lights. Install grab bars by the toilet and in the tub and shower. Do not use towel bars as grab bars. Use non-skid mats or decals in the tub or shower. If you need to sit down in the shower, use a plastic, non-slip stool. Keep the floor dry. Clean up any water that spills on the floor as soon as it happens. Remove soap buildup in the tub or shower regularly. Attach bath mats securely with double-sided non-slip rug tape. Do not have throw rugs and other things on the floor that can make you trip. What can I do in the bedroom? Use night lights. Make sure that you have a  light by your bed that is easy to reach. Do not use any sheets or blankets that are too big for your bed. They should not hang down onto the floor. Have a firm chair that has side arms. You can use this for support while you get dressed. Do not have throw rugs and other things on the floor that can make you trip. What can I do in the kitchen? Clean up any spills right away. Avoid walking on wet floors. Keep items that you use a lot in easy-to-reach places. If you need to reach something above you, use a strong step stool that has a grab bar. Keep electrical cords out of the way. Do not use floor polish or wax that makes floors slippery. If you must use wax, use non-skid floor wax. Do not have throw rugs and other things on the floor that can make you trip. What can I do with my stairs? Do not leave any items on the stairs. Make sure that there are handrails on both sides of the stairs and use them. Fix handrails that are broken or loose. Make sure that handrails are as long as the stairways. Check any  carpeting to make sure that it is firmly attached to the stairs. Fix any carpet that is loose or worn. Avoid having throw rugs at the top or bottom of the stairs. If you do have throw rugs, attach them to the floor with carpet tape. Make sure that you have a light switch at the top of the stairs and the bottom of the stairs. If you do not have them, ask someone to add them for you. What else can I do to help prevent falls? Wear shoes that: Do not have high heels. Have rubber bottoms. Are comfortable and fit you well. Are closed at the toe. Do not wear sandals. If you use a stepladder: Make sure that it is fully opened. Do not climb a closed stepladder. Make sure that both sides of the stepladder are locked into place. Ask someone to hold it for you, if possible. Clearly mark and make sure that you can see: Any grab bars or handrails. First and last steps. Where the edge of each step  is. Use tools that help you move around (mobility aids) if they are needed. These include: Canes. Walkers. Scooters. Crutches. Turn on the lights when you go into a dark area. Replace any light bulbs as soon as they burn out. Set up your furniture so you have a clear path. Avoid moving your furniture around. If any of your floors are uneven, fix them. If there are any pets around you, be aware of where they are. Review your medicines with your doctor. Some medicines can make you feel dizzy. This can increase your chance of falling. Ask your doctor what other things that you can do to help prevent falls. This information is not intended to replace advice given to you by your health care provider. Make sure you discuss any questions you have with your health care provider. Document Released: 10/16/2008 Document Revised: 05/28/2015 Document Reviewed: 01/24/2014 Elsevier Interactive Patient Education  2017 [employer].

## 2022-07-07 ENCOUNTER — Encounter: Payer: Self-pay | Admitting: Family Medicine

## 2022-09-08 ENCOUNTER — Encounter: Payer: Self-pay | Admitting: Family Medicine

## 2023-02-10 ENCOUNTER — Encounter: Payer: Self-pay | Admitting: Family Medicine

## 2023-02-22 ENCOUNTER — Other Ambulatory Visit: Payer: Self-pay | Admitting: Family Medicine

## 2023-02-22 DIAGNOSIS — E890 Postprocedural hypothyroidism: Secondary | ICD-10-CM

## 2023-06-05 ENCOUNTER — Telehealth (INDEPENDENT_AMBULATORY_CARE_PROVIDER_SITE_OTHER): Admitting: Family Medicine

## 2023-06-05 ENCOUNTER — Encounter: Payer: Self-pay | Admitting: Family Medicine

## 2023-06-05 VITALS — BP 140/72 | HR 64 | Temp 98.0°F | Resp 16 | Ht 66.0 in | Wt 215.0 lb

## 2023-06-05 DIAGNOSIS — R5383 Other fatigue: Secondary | ICD-10-CM | POA: Diagnosis not present

## 2023-06-05 DIAGNOSIS — E890 Postprocedural hypothyroidism: Secondary | ICD-10-CM

## 2023-06-05 NOTE — Progress Notes (Signed)
 Chief Complaint  Patient presents with   Fatigue    Discuss Fatigue     Subjective: Patient is a 83 y.o. female here for f/u. We are interacting via web portal for an electronic face-to-face visit. I verified patient's ID using 2 identifiers. Patient agreed to proceed with visit via this method. Patient is at home, I am at office. Patient and I are present for visit.   For over a year, pt has been have significant fatigue. She gets very tired after eating. Diet is healthy. Mood is stable. She walks and lifts wts routinely for exercise. No N/V/D, bleeding, unintentional weight changes. She does snore and sometimes wakes up feeling that she hasn't slept at all.  She was referred to the sleep team last year but was concerned a sleep test would be rendered useless with her nocturia.  Hypothyroidism Patient presents for follow-up of hypothyroidism.  Reports compliance with medication- Synthroid  125 mcg/d. Current symptoms include: Fatigue Denies weight changes, heat/cold intolerance, bowel/skin changes or CVS symptoms She believes her dose should be not significantly changed  Past Medical History:  Diagnosis Date   Allergy 2022   Cancer [facility]) 2023   Surgery on scalp   Cataract 2010   Surgery on both eyes   Heart murmur 1960   Hypertension    Thyroid  disease     Objective: BP (!) 140/72 (BP Location: Left Arm, Patient Position: Sitting)   Pulse 64   Temp 98 F (36.7 C) (Oral)   Resp 16   Ht 5\' 6"  (1.676 m)   Wt 215 lb (97.5 kg)   BMI 34.70 kg/m  No conversational dyspnea Age appropriate judgment and insight Nml affect and mood  Assessment and Plan: Postablative hypothyroidism - Plan: TSH, T4, free  Fatigue, unspecified type - Plan: Comprehensive metabolic panel with GFR, CBC  Chronic, hopefully stable. Ck labs. Cont Synthroid  125 mcg/d.  Long standing issue. Ck labs.  Diet and exercise seems sufficient.  No complaints of mood issues.  Will check metabolic causes as above.   If negative, would consider evaluation with the sleep team.  She was concerned about nocturia affecting results but I would like her to meet with that team for discussion before proceeding further. The patient voiced understanding and agreement to the plan.  Shellie Dials [place], DO 06/05/23  10:45 AM

## 2023-06-06 ENCOUNTER — Encounter: Payer: Self-pay | Admitting: Family Medicine

## 2023-06-06 NOTE — Telephone Encounter (Signed)
 Called pt and lab appt scheduled.

## 2023-06-08 ENCOUNTER — Other Ambulatory Visit (INDEPENDENT_AMBULATORY_CARE_PROVIDER_SITE_OTHER)

## 2023-06-08 DIAGNOSIS — R5383 Other fatigue: Secondary | ICD-10-CM | POA: Diagnosis not present

## 2023-06-08 DIAGNOSIS — E890 Postprocedural hypothyroidism: Secondary | ICD-10-CM

## 2023-06-08 LAB — CBC
HCT: 37 % (ref 36.0–46.0)
Hemoglobin: 12.7 g/dL (ref 12.0–15.0)
MCHC: 34.4 g/dL (ref 30.0–36.0)
MCV: 94.3 fl (ref 78.0–100.0)
Platelets: 161 10*3/uL (ref 150.0–400.0)
RBC: 3.92 Mil/uL (ref 3.87–5.11)
RDW: 12.8 % (ref 11.5–15.5)
WBC: 4.9 10*3/uL (ref 4.0–10.5)

## 2023-06-08 LAB — COMPREHENSIVE METABOLIC PANEL WITH GFR
ALT: 11 U/L (ref 0–35)
AST: 15 U/L (ref 0–37)
Albumin: 4 g/dL (ref 3.5–5.2)
Alkaline Phosphatase: 67 U/L (ref 39–117)
BUN: 19 mg/dL (ref 6–23)
CO2: 28 meq/L (ref 19–32)
Calcium: 9.4 mg/dL (ref 8.4–10.5)
Chloride: 104 meq/L (ref 96–112)
Creatinine, Ser: 1.02 mg/dL (ref 0.40–1.20)
GFR: 51.14 mL/min — ABNORMAL LOW (ref >60.00)
Glucose, Bld: 97 mg/dL (ref 70–99)
Potassium: 4.3 meq/L (ref 3.5–5.1)
Sodium: 141 meq/L (ref 135–145)
Total Bilirubin: 0.6 mg/dL (ref 0.2–1.2)
Total Protein: 6.2 g/dL (ref 6.0–8.3)

## 2023-06-08 LAB — T4, FREE: Free T4: 1.07 ng/dL (ref 0.60–1.60)

## 2023-06-08 LAB — TSH: TSH: 1.39 u[IU]/mL (ref 0.35–5.50)

## 2023-06-09 ENCOUNTER — Ambulatory Visit: Payer: Self-pay | Admitting: Family Medicine

## 2023-06-14 ENCOUNTER — Telehealth: Payer: Self-pay | Admitting: Family Medicine

## 2023-06-14 NOTE — Telephone Encounter (Signed)
 Copied from CRM [id number]. Topic: Medicare AWV >> Jun 14, 2023  2:35 PM Juliana Ocean wrote: Reason for CRM: LVM 06/14/2023 to schedule AWV. Please schedule Virtual or Telehealth visits ONLY.   Rosalee Collins; Care Guide Ambulatory Clinical Support [place] [facility] Direct Dial: [phone]

## 2023-07-10 ENCOUNTER — Other Ambulatory Visit: Payer: Self-pay | Admitting: Family Medicine

## 2023-07-10 DIAGNOSIS — E890 Postprocedural hypothyroidism: Secondary | ICD-10-CM

## 2024-01-09 ENCOUNTER — Ambulatory Visit
